# Patient Record
Sex: Female | Born: 1985 | Race: Black or African American | Hispanic: No | Smoking: Current every day smoker
Health system: Southern US, Community
[De-identification: ages and names within clinical notes are randomized; demographics above are authoritative.]

## PROBLEM LIST (undated history)

## (undated) ENCOUNTER — Inpatient Hospital Stay (HOSPITAL_COMMUNITY): Payer: Self-pay

## (undated) ENCOUNTER — Inpatient Hospital Stay (HOSPITAL_COMMUNITY): Payer: Medicaid Other

## (undated) DIAGNOSIS — D649 Anemia, unspecified: Secondary | ICD-10-CM

## (undated) DIAGNOSIS — F419 Anxiety disorder, unspecified: Secondary | ICD-10-CM

## (undated) DIAGNOSIS — Z9109 Other allergy status, other than to drugs and biological substances: Secondary | ICD-10-CM

## (undated) DIAGNOSIS — R011 Cardiac murmur, unspecified: Secondary | ICD-10-CM

## (undated) HISTORY — PX: PILONIDAL CYST EXCISION: SHX744

---

## 2008-08-20 ENCOUNTER — Emergency Department (HOSPITAL_COMMUNITY): Admission: EM | Admit: 2008-08-20 | Discharge: 2008-08-20 | Payer: Self-pay | Admitting: Emergency Medicine

## 2008-09-27 ENCOUNTER — Emergency Department (HOSPITAL_COMMUNITY): Admission: EM | Admit: 2008-09-27 | Discharge: 2008-09-27 | Payer: Self-pay | Admitting: Emergency Medicine

## 2011-08-31 LAB — URINE MICROSCOPIC-ADD ON

## 2011-08-31 LAB — URINALYSIS, ROUTINE W REFLEX MICROSCOPIC
Ketones, ur: 15 — AB
Leukocytes, UA: NEGATIVE
Nitrite: NEGATIVE
Specific Gravity, Urine: 1.022
pH: 6.5

## 2013-03-31 LAB — OB RESULTS CONSOLE HIV ANTIBODY (ROUTINE TESTING): HIV: NONREACTIVE

## 2013-03-31 LAB — OB RESULTS CONSOLE GC/CHLAMYDIA: Chlamydia: NEGATIVE

## 2013-03-31 LAB — OB RESULTS CONSOLE HEPATITIS B SURFACE ANTIGEN: Hepatitis B Surface Ag: NEGATIVE

## 2013-03-31 LAB — OB RESULTS CONSOLE ABO/RH: RH Type: POSITIVE

## 2013-03-31 LAB — OB RESULTS CONSOLE RUBELLA ANTIBODY, IGM: Rubella: IMMUNE

## 2013-09-22 ENCOUNTER — Encounter (HOSPITAL_COMMUNITY): Payer: Self-pay | Admitting: General Practice

## 2013-09-22 ENCOUNTER — Inpatient Hospital Stay (HOSPITAL_COMMUNITY)
Admission: AD | Admit: 2013-09-22 | Discharge: 2013-09-22 | Disposition: A | Discharge status: Home / Self Care | Payer: Medicaid Other | Source: Ambulatory Visit | Attending: Obstetrics & Gynecology | Admitting: Obstetrics & Gynecology

## 2013-09-22 DIAGNOSIS — R030 Elevated blood-pressure reading, without diagnosis of hypertension: Secondary | ICD-10-CM | POA: Insufficient documentation

## 2013-09-22 DIAGNOSIS — O99891 Other specified diseases and conditions complicating pregnancy: Secondary | ICD-10-CM

## 2013-09-22 DIAGNOSIS — R51 Headache: Secondary | ICD-10-CM

## 2013-09-22 DIAGNOSIS — I952 Hypotension due to drugs: Secondary | ICD-10-CM

## 2013-09-22 HISTORY — DX: Other allergy status, other than to drugs and biological substances: Z91.09

## 2013-09-22 LAB — COMPREHENSIVE METABOLIC PANEL
ALT: 16 U/L (ref 0–35)
Alkaline Phosphatase: 79 U/L (ref 39–117)
CO2: 23 mEq/L (ref 19–32)
Chloride: 103 mEq/L (ref 96–112)
GFR calc Af Amer: >90 mL/min (ref >90)
GFR calc non Af Amer: >90 mL/min (ref >90)
Glucose, Bld: 76 mg/dL (ref 70–99)
Potassium: 3.8 mEq/L (ref 3.5–5.1)
Sodium: 134 mEq/L — ABNORMAL LOW (ref 135–145)
Total Bilirubin: 0.2 mg/dL — ABNORMAL LOW (ref 0.3–1.2)

## 2013-09-22 LAB — URINALYSIS, ROUTINE W REFLEX MICROSCOPIC
Bilirubin Urine: NEGATIVE
Hgb urine dipstick: NEGATIVE
Specific Gravity, Urine: 1.02 (ref 1.005–1.030)
Urobilinogen, UA: 0.2 mg/dL (ref 0.0–1.0)

## 2013-09-22 LAB — CBC
Hemoglobin: 11.1 g/dL — ABNORMAL LOW (ref 12.0–15.0)
MCH: 28 pg (ref 26.0–34.0)
RBC: 3.97 MIL/uL (ref 3.87–5.11)
WBC: 7.2 10*3/uL (ref 4.0–10.5)

## 2013-09-22 LAB — PROTEIN / CREATININE RATIO, URINE
Protein Creatinine Ratio: 0.12 (ref 0.00–0.15)
Total Protein, Urine: 16.8 mg/dL

## 2013-09-22 NOTE — MAU Provider Note (Signed)
Chief Complaint:  Headache   Sabrina Bray is a 27 y.o.  G3P1011 with IUP at [redacted]w[redacted]d presenting for Headache  Pt reports onset of headache 2 days ago. She has taken Tylenol 1000 mg on 4 separate occasions since that time. Each time the headache decreases but then returns. 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 Pt denies any prior issues with elevated BP or hypertension. An NP at her job then gave her Clonidine PO 0.2 mg in order to prevent her from "stroking out." Pt then presented to MAU for further evaluation. Since being in the MAU pt's BP has ranged 95-110/40-67. She states her systolic blood pressure typically runs in the low 100s. She continues to experience headache and also endorses feeling sedated since taking the Clonidine. She denies vision changes, RUQ abdominal pain, chest pain, SOB or lower extremity swelling. She denies contractions, vaginal bleeding and LOF. She endorses normal FM.   Pt recently relocated to the area and has not yet established care with a prenatal provider due to insurance issues. She was previously being seen for prenatal care in [place], [place] and reports attending regular appointments until her move. She has not had any prenatal care in 6 weeks. She reports a normal prenatal course until this point. She reports having a normal anatomy scan and is having a girl.    Menstrual History: OB History   Grav Para Term Preterm Abortions TAB SAB Ect Mult Living   3 1 1  1  1   1       Patient's last menstrual period was 02/09/2013.      Past Medical History  Diagnosis Date  . Environmental allergies     Past Surgical History  Procedure Laterality Date  . Cesarean section N/A 11/03/2011  . Pilonidal cyst excision N/A 02/2008    Family History  Problem Relation Age of Onset  . Hypertension Maternal Grandmother   . Hypertension Paternal Grandmother     History  Substance Use Topics  . Smoking  status: Current Every Day Smoker -- 0.50 packs/day  . Smokeless tobacco: Never Used  . Alcohol Use: No     No Known Allergies  Prescriptions prior to admission  Medication Sig Dispense Refill  . acetaminophen (TYLENOL) 500 MG tablet Take 1,000 mg by mouth every 6 (six) hours as needed for pain.      . Docosahexaenoic Acid (PRENATAL DHA PO) Take 1 tablet by mouth daily.        Review of Systems - Negative except for what is mentioned in HPI.  Physical Exam  Blood pressure 95/40, pulse 81, temperature 98.2 F (36.8 C), temperature source Oral, resp. rate 18, height 5' 2.5" (1.588 m), weight 79.833 kg (176 lb), last menstrual period 02/09/2013. GENERAL: Well-developed, well-nourished female in no acute distress.  LUNGS: Clear to auscultation bilaterally.  HEART: Regular rate and rhythm. ABDOMEN: Soft, nontender, nondistended, gravid.  EXTREMITIES: Nontender, no edema, 2+ distal pulses. Cervical Exam: Not done FHT:  Baseline rate 135 bpm   Variability moderate  Accelerations present   Decelerations none Contractions: none   Labs: Results for orders placed during the hospital encounter of 09/22/13 (from the past 24 hour(s))  URINALYSIS, ROUTINE W REFLEX MICROSCOPIC   Collection Time    09/22/13  4:00 PM      Result Value Range   Color, Urine YELLOW  YELLOW   APPearance CLEAR  CLEAR   Specific Gravity,  Urine 1.020  1.005 - 1.030   pH 6.5  5.0 - 8.0   Glucose, UA NEGATIVE  NEGATIVE mg/dL   Hgb urine dipstick NEGATIVE  NEGATIVE   Bilirubin Urine NEGATIVE  NEGATIVE   Ketones, ur NEGATIVE  NEGATIVE mg/dL   Protein, ur NEGATIVE  NEGATIVE mg/dL   Urobilinogen, UA 0.2  0.0 - 1.0 mg/dL   Nitrite NEGATIVE  NEGATIVE   Leukocytes, UA NEGATIVE  NEGATIVE  PROTEIN / CREATININE RATIO, URINE   Collection Time    09/22/13  4:00 PM      Result Value Range   Creatinine, Urine 134.56     Total Protein, Urine 16.8     PROTEIN CREATININE RATIO 0.12  0.00 - 0.15  CBC   Collection Time     09/22/13  5:54 PM      Result Value Range   WBC 7.2  4.0 - 10.5 K/uL   RBC 3.97  3.87 - 5.11 MIL/uL   Hemoglobin 11.1 (*) 12.0 - 15.0 g/dL   HCT 16.1 (*) 09.6 - 04.5 %   MCV 84.1  78.0 - 100.0 fL   MCH 28.0  26.0 - 34.0 pg   MCHC 33.2  30.0 - 36.0 g/dL   RDW 40.9  81.1 - 91.4 %   Platelets 168  150 - 400 K/uL  COMPREHENSIVE METABOLIC PANEL   Collection Time    09/22/13  5:54 PM      Result Value Range   Sodium 134 (*) 135 - 145 mEq/L   Potassium 3.8  3.5 - 5.1 mEq/L   Chloride 103  96 - 112 mEq/L   CO2 23  19 - 32 mEq/L   Glucose, Bld 76  70 - 99 mg/dL   BUN 6  6 - 23 mg/dL   Creatinine, Ser 7.82 (*) 0.50 - 1.10 mg/dL   Calcium 8.9  8.4 - 95.6 mg/dL   Total Protein 6.9  6.0 - 8.3 g/dL   Albumin 3.1 (*) 3.5 - 5.2 g/dL   AST 15  0 - 37 U/L   ALT 16  0 - 35 U/L   Alkaline Phosphatase 79  39 - 117 U/L   Total Bilirubin 0.2 (*) 0.3 - 1.2 mg/dL   GFR calc non Af Amer >90  >90 mL/min   GFR calc Af Amer >90  >90 mL/min    Imaging Studies:  No results found.  Assessment: Sabrina Bray is  27 y.o. G3P1011 at [redacted]w[redacted]d presents with Headache, report of isolated severe range BP earlier today and now with low BP due to Clonidine.   BPs in MAU have been low-normal likely due to medication side effect of Clonidine. FHR reactive and reassuring. CBC, CMP and urine protein creatinine ratio are normal and were done given her hx of one elevated bp at work (although all low normal here)  Plan: -  monitored pt for additional1-2 hours to ensure BPs have reached their nadir.  - Encouraged pt to abstain from taking any medication that is not prescribed to her during pregnancy by an OB provider.  - Message sent to front desk at [facility] requesting follow up appointment in clinic on Monday 09/25/13 for BP check and re-initiation of prenatal care.  - Recommend rest, fluids, Tylenol PRN for headache. - Precautions given and pt advised to return to MAU if she develops BP >140/90, severe headache,  vision changes, lower extremity swelling or RUQ abdominal pain.   Pt seen and discussed with Dr. Reola Calkins.  Cowart, Ryann 10/24/20146:05 PM  I have seen and examined this patient and agree with above documentation in the resident's note. Pt was observed for approx 4 hours and bp ranged 90s-110s/40s-70s. Pt started feeling slightly improved after eating. PIH labs negative as above. 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 Reasons to return as above given. Case discussed with Dr. Debroah Loop who agreed with above plan and d/c to home with f/u on Monday for BP check and scheduling of NOBV.  Rulon Abide, M.D. [facility] 09/22/2013 9:39 PM

## 2013-09-22 NOTE — MAU Note (Signed)
Pt. States she's had a headache x2 days. Has taken some Tylenol with some relief but states headache returns. Was at work and asked someone to take her blood pressure. Pt. States her blood pressure was 167/102. Pt states there was a NP gave her a dose of minipres or cataprea 0.25 or 0.5.

## 2013-09-26 ENCOUNTER — Other Ambulatory Visit: Payer: Medicaid Other | Admitting: General Practice

## 2013-09-26 VITALS — BP 120/76 | HR 92 | Temp 97.5°F | Ht 62.0 in | Wt 175.0 lb

## 2013-09-26 DIAGNOSIS — Z3490 Encounter for supervision of normal pregnancy, unspecified, unspecified trimester: Secondary | ICD-10-CM

## 2013-09-26 LAB — CBC
MCH: 27.9 pg (ref 26.0–34.0)
MCHC: 33.8 g/dL (ref 30.0–36.0)
MCV: 82.7 fL (ref 78.0–100.0)
Platelets: 177 10*3/uL (ref 150–400)

## 2013-09-26 NOTE — Addendum Note (Signed)
Addended by: Kathee Delton on: 09/26/2013 11:00 AM   Modules accepted: Orders

## 2013-09-27 ENCOUNTER — Encounter: Payer: Self-pay | Admitting: *Deleted

## 2013-09-27 ENCOUNTER — Encounter: Payer: Self-pay | Admitting: Obstetrics and Gynecology

## 2013-09-27 DIAGNOSIS — O34219 Maternal care for unspecified type scar from previous cesarean delivery: Secondary | ICD-10-CM | POA: Insufficient documentation

## 2013-09-27 LAB — RPR

## 2013-10-02 ENCOUNTER — Encounter: Payer: Self-pay | Admitting: Family Medicine

## 2013-10-02 ENCOUNTER — Ambulatory Visit (INDEPENDENT_AMBULATORY_CARE_PROVIDER_SITE_OTHER): Payer: Medicaid Other | Admitting: Family Medicine

## 2013-10-02 VITALS — BP 127/86 | Temp 98.5°F | Wt 174.8 lb

## 2013-10-02 DIAGNOSIS — O34219 Maternal care for unspecified type scar from previous cesarean delivery: Secondary | ICD-10-CM

## 2013-10-02 LAB — POCT URINALYSIS DIP (DEVICE)
Hgb urine dipstick: NEGATIVE
Nitrite: NEGATIVE
Protein, ur: 30 mg/dL — AB
pH: 6.5 (ref 5.0–8.0)

## 2013-10-02 NOTE — Progress Notes (Signed)
P= 100  New OB packet given to patient.  C/o intermittent pelvic pressure and braxton hicks contractions.  Pt. 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

## 2013-10-02 NOTE — Patient Instructions (Signed)

## 2013-10-02 NOTE — Progress Notes (Signed)
  Subjective:    Sabrina Bray is being seen today for her first obstetrical visit.  She is a transfer of care from [facility]. This is a planned pregnancy. She is at [redacted]w[redacted]d gestation. Her obstetrical history is significant for hx of LTCS and desires repeat c/s. Patient does intend to breast feed. Pregnancy history fully reviewed.  Patient reports no contractions, no cramping and no leaking. +FM. Some braxton hicks  OB History  Gravida Para Term Preterm AB SAB TAB Ectopic Multiple Living  3 1 1  1 1    1     # Outcome Date GA Lbr Len/2nd Weight Sex Delivery Anes PTL Lv  3 CUR           2 TRM 11/03/11 [redacted]w[redacted]d  3.345 kg (7 lb 6 oz) M LTCS Spinal N Y  1 SAB 04/2008             G1- SAB G2- Term LTCS. Dilated to 8cm and failed to dilate more.   G3- current  Past Medical History  Diagnosis Date  . Environmental allergies    History   Social History  . Marital Status: Unknown    Spouse Name: N/A    Number of Children: N/A  . Years of Education: N/A   Occupational History  . Not on file.   Social History Main Topics  . Smoking status: Current Every Day Smoker -- 0.50 packs/day    Types: Cigarettes  . Smokeless tobacco: Never Used  . Alcohol Use: No  . Drug Use: No  . Sexual Activity: Not Currently    Birth Control/ Protection: None   Other Topics Concern  . 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 Moved to [place] from [place], [place] in August 2014.   \ Family History  Problem Relation Age of Onset  . Hypertension Maternal Grandmother   . Cancer Maternal Grandmother     ovarian   . Hypertension Paternal Grandmother      Review of Systems:   Review of Systems See above  Objective:     BP 127/86  Temp(Src) 98.5 F (36.9 C)  Wt 79.289 kg (174 lb 12.8 oz)  LMP 02/09/2013 Physical Exam  Exam GENERAL: Well-developed, well-nourished female in no acute distress.  HEENT: Normocephalic, atraumatic. Sclerae anicteric.  NECK: Supple.  Normal thyroid.  LUNGS: Clear to auscultation bilaterally.  HEART: Regular rate and rhythm. BREASTS: deferred ABDOMEN: Soft, nontender, nondistended. Uterus approp for dates.  PELVIC: Normal external female genitalia. Vagina is pink and rugated.  Normal discharge. Normal cervix contour. Pap smear obtained.  No adnexal mass or tenderness.  EXTREMITIES: No cyanosis, clubbing, or edema, 2+ distal pulses.    Assessment:    Pregnancy: G3P1011 Patient Active Problem List   Diagnosis Date Noted  . Previous cesarean delivery, antepartum condition or complication 09/27/2013       Plan:     28 week labs reviewed- normal Prenatal vitamins. Problem list reviewed and updated. Obtain records from [facility]  Desires repeat c/s- will schedule after dating confirmed from records.  Follow up in 2 weeks. 50% of 30 min visit spent on counseling and coordination of care.     Kristina Bertone L 10/02/2013

## 2013-10-03 LAB — PRESCRIPTION MONITORING PROFILE (19 PANEL)
Amphetamine/Meth: NEGATIVE ng/mL
Benzodiazepine Screen, Urine: NEGATIVE ng/mL
Buprenorphine, Urine: NEGATIVE ng/mL
Cannabinoid Scrn, Ur: NEGATIVE ng/mL
Carisoprodol, Urine: NEGATIVE ng/mL
MDMA URINE: NEGATIVE ng/mL
Methadone Screen, Urine: NEGATIVE ng/mL
Methaqualone: NEGATIVE ng/mL
Phencyclidine, Ur: NEGATIVE ng/mL
Propoxyphene: NEGATIVE ng/mL
Tapentadol, urine: NEGATIVE ng/mL
Zolpidem, Urine: NEGATIVE ng/mL

## 2013-10-16 ENCOUNTER — Encounter: Payer: Medicaid Other | Admitting: Obstetrics and Gynecology

## 2013-10-23 ENCOUNTER — Ambulatory Visit (INDEPENDENT_AMBULATORY_CARE_PROVIDER_SITE_OTHER): Payer: Medicaid Other | Admitting: Advanced Practice Midwife

## 2013-10-23 VITALS — BP 131/86 | Temp 98.6°F | Wt 179.2 lb

## 2013-10-23 DIAGNOSIS — O0931 Supervision of pregnancy with insufficient antenatal care, first trimester: Secondary | ICD-10-CM

## 2013-10-23 DIAGNOSIS — O34219 Maternal care for unspecified type scar from previous cesarean delivery: Secondary | ICD-10-CM

## 2013-10-23 DIAGNOSIS — O0933 Supervision of pregnancy with insufficient antenatal care, third trimester: Secondary | ICD-10-CM

## 2013-10-23 DIAGNOSIS — O0930 Supervision of pregnancy with insufficient antenatal care, unspecified trimester: Secondary | ICD-10-CM

## 2013-10-23 LAB — POCT URINALYSIS DIP (DEVICE)
Bilirubin Urine: NEGATIVE
Ketones, ur: NEGATIVE mg/dL
Protein, ur: 30 mg/dL — AB
Specific Gravity, Urine: >=1.03 (ref 1.005–1.030)

## 2013-10-23 NOTE — Progress Notes (Signed)
Doing well.  Good fetal movement, denies vaginal bleeding, LOF, regular contractions.  Evaluated stretch mark on abdomen, discussed normal skin change.  Denies h/a, epigastric pain, visual disturbances.  Pt desires repeat C/S, GBS not collected today.

## 2013-10-23 NOTE — Progress Notes (Signed)
Pulse- 102  Pain-sharp  Pt reports a "vein like" stretch mark on her belly

## 2013-10-24 LAB — GC/CHLAMYDIA PROBE AMP
CT Probe RNA: NEGATIVE
GC Probe RNA: NEGATIVE

## 2013-11-01 ENCOUNTER — Ambulatory Visit (INDEPENDENT_AMBULATORY_CARE_PROVIDER_SITE_OTHER): Payer: Medicaid Other | Admitting: Obstetrics and Gynecology

## 2013-11-01 VITALS — BP 129/83 | Wt 177.8 lb

## 2013-11-01 DIAGNOSIS — O34219 Maternal care for unspecified type scar from previous cesarean delivery: Secondary | ICD-10-CM

## 2013-11-01 LAB — POCT URINALYSIS DIP (DEVICE)
Bilirubin Urine: NEGATIVE
Glucose, UA: NEGATIVE mg/dL
Ketones, ur: 15 mg/dL — AB
Specific Gravity, Urine: 1.025 (ref 1.005–1.030)
Urobilinogen, UA: 1 mg/dL (ref 0.0–1.0)

## 2013-11-01 NOTE — Progress Notes (Signed)
Good fetal movement. Continues to have irregular contractions with mild pain. No lose of fluids, or vaginal bleeding. Wants to schedule repeat C-section. Note to [place] to schedule.

## 2013-11-01 NOTE — Patient Instructions (Signed)
Cesarean Delivery °Care After °Refer to this sheet in the next few weeks. These instructions provide you with information on caring for yourself after your procedure. Your health care provider may also give you specific instructions. Your treatment has been planned according to current medical practices, but problems sometimes occur. Call your health care provider if you have any problems or questions after you go home. °HOME CARE INSTRUCTIONS  °· Only take over-the-counter or prescription medications as directed by your health care provider. °· Do not drink alcohol, especially if you are breastfeeding or taking medication to relieve pain. °· Do not chew or smoke tobacco. °· Continue to use good perineal care. Good perineal care includes: °· Wiping your perineum from front to back. °· Keeping your perineum clean. °· Check your surgical cut (incision) daily for increased redness, drainage, swelling, or separation of skin. °· Clean your incision gently with soap and water every day, and then pat it dry. If your health care provider says it is OK, leave the incision uncovered. Use a bandage (dressing) if the incision is draining fluid or appears irritated. If the adhesive strips across the incision do not fall off within 7 days, carefully peel them off. °· Hug a pillow when coughing or sneezing until your incision is healed. This helps to relieve pain. °· Do not use tampons or douche until your health care provider says it is okay. °· Shower, wash your hair, and take tub baths as directed by your health care provider. °· Wear a well-fitting bra that provides breast support. °· Limit wearing support panties or control-top hose. °· Drink enough fluids to keep your urine clear or pale yellow. °· Eat high-fiber foods such as whole grain cereals and breads, brown rice, beans, and fresh fruits and vegetables every day. These foods may help prevent or relieve constipation. °· Resume activities such as climbing stairs,  driving, lifting, exercising, or traveling as directed by your health care provider. °· Talk to your health care provider about resuming sexual activities. This is dependent upon your risk of infection, your rate of healing, and your comfort and desire to resume sexual activity. °· Try to have someone help you with your household activities and your newborn for at least a few days after you leave the hospital. °· Rest as much as possible. Try to rest or take a nap when your newborn is sleeping. °· Increase your activities gradually. °· Keep all of your scheduled postpartum appointments. It is very important to keep your scheduled follow-up appointments. At these appointments, your health care provider will be checking to make sure that you are healing physically and emotionally. °SEEK MEDICAL CARE IF:  °· You are passing large clots from your vagina. Save any clots to show your health care provider. °· You have a foul smelling discharge from your vagina. °· You have trouble urinating. °· You are urinating frequently. °· You have pain when you urinate. °· You have a change in your bowel movements. °· You have increasing redness, pain, or swelling near your incision. °· You have pus draining from your incision. °· Your incision is separating. °· You have painful, hard, or reddened breasts. °· You have a severe headache. °· You have blurred vision or see spots. °· You feel sad or depressed. °· You have thoughts of hurting yourself or your newborn. °· You have questions about your care, the care of your newborn, or medications. °· You are dizzy or lightheaded. °· You have a rash. °· You   have pain, redness, or swelling at the site of the removed intravenous access (IV) tube. °· You have nausea or vomiting. °· You stopped breastfeeding and have not had a menstrual period within 12 weeks of stopping. °· You are not breastfeeding and have not had a menstrual period within 12 weeks of delivery. °· You have a fever. °SEEK  IMMEDIATE MEDICAL CARE IF: °· You have persistent pain. °· You have chest pain. °· You have shortness of breath. °· You faint. °· You have leg pain. °· You have stomach pain. °· Your vaginal bleeding saturates 2 or more sanitary pads in 1 hour. °MAKE SURE YOU:  °· Understand these instructions. °· Will watch your condition. °· Will get help right away if you are not doing well or get worse. °Document Released: 08/08/2002 Document Revised: 07/19/2013 Document Reviewed: 07/13/2012 °ExitCare® Patient Information ©2014 [employer]. ° ° ° °

## 2013-11-01 NOTE — Progress Notes (Signed)
Pulse: 82 Pt states that she is supposed to be scheduled for a csection for next week but hasn't heard anything. Very uncomfortable ready to have this baby.

## 2013-11-07 ENCOUNTER — Encounter (HOSPITAL_COMMUNITY)
Admission: RE | Admit: 2013-11-07 | Discharge: 2013-11-07 | Disposition: A | Discharge status: Home / Self Care | Payer: Medicaid Other | Source: Ambulatory Visit | Attending: Obstetrics & Gynecology | Admitting: Obstetrics & Gynecology

## 2013-11-07 ENCOUNTER — Encounter (HOSPITAL_COMMUNITY): Payer: Self-pay

## 2013-11-07 VITALS — BP 117/87 | HR 92 | Resp 18 | Ht 62.0 in | Wt 177.0 lb

## 2013-11-07 DIAGNOSIS — O34219 Maternal care for unspecified type scar from previous cesarean delivery: Secondary | ICD-10-CM

## 2013-11-07 HISTORY — DX: Cardiac murmur, unspecified: R01.1

## 2013-11-07 HISTORY — DX: Anxiety disorder, unspecified: F41.9

## 2013-11-07 HISTORY — DX: Anemia, unspecified: D64.9

## 2013-11-07 LAB — CBC
HCT: 32.8 % — ABNORMAL LOW (ref 36.0–46.0)
Hemoglobin: 11 g/dL — ABNORMAL LOW (ref 12.0–15.0)
MCH: 28 pg (ref 26.0–34.0)
MCV: 83.5 fL (ref 78.0–100.0)
Platelets: 178 10*3/uL (ref 150–400)
RBC: 3.93 MIL/uL (ref 3.87–5.11)
WBC: 6.3 10*3/uL (ref 4.0–10.5)

## 2013-11-07 LAB — TYPE AND SCREEN

## 2013-11-07 LAB — ABO/RH: ABO/RH(D): O POS

## 2013-11-07 LAB — RPR: RPR Ser Ql: NONREACTIVE

## 2013-11-07 NOTE — Patient Instructions (Addendum)
   Your procedure is scheduled on: Thursday, Dec 11  Enter through the [employer] of [facility] at:  1130 am Pick up the phone at the desk and dial [phone] and inform us of your arrival.  Please call this number if you have any problems the morning of surgery: [phone]  Remember: Do not eat food after midnight: Wednesday Do not drink clear liquids after:  9 am Thursday Take these medicines the morning of surgery with a SIP OF WATER:   None  Do not wear jewelry, make-up, or FINGER nail polish No metal in your hair or on your body. Do not wear lotions, powders, perfumes. You may wear deodorant.  Please use your CHG wash as directed prior to surgery.  Do not shave anywhere for at least 12 hours prior to first CHG shower.  Do not bring valuables to the hospital. Contacts, dentures or bridgework may not be worn into surgery.  Leave suitcase in the car. After Surgery it may be brought to your room. For patients being admitted to the hospital, checkout time is 11:00am the day of discharge.  Home with fob Greg cell [phone].

## 2013-11-08 ENCOUNTER — Encounter: Payer: Self-pay | Admitting: *Deleted

## 2013-11-09 ENCOUNTER — Inpatient Hospital Stay (HOSPITAL_COMMUNITY): Payer: Medicaid Other | Admitting: Anesthesiology

## 2013-11-09 ENCOUNTER — Encounter (HOSPITAL_COMMUNITY): Payer: Self-pay | Admitting: *Deleted

## 2013-11-09 ENCOUNTER — Encounter (HOSPITAL_COMMUNITY): Payer: Medicaid Other | Admitting: Anesthesiology

## 2013-11-09 ENCOUNTER — Inpatient Hospital Stay (HOSPITAL_COMMUNITY)
Admission: RE | Admit: 2013-11-09 | Discharge: 2013-11-11 | DRG: 766 | Disposition: A | Discharge status: Home / Self Care | Payer: Medicaid Other | Source: Ambulatory Visit | Attending: Obstetrics & Gynecology | Admitting: Obstetrics & Gynecology

## 2013-11-09 ENCOUNTER — Encounter (HOSPITAL_COMMUNITY)
Admission: RE | Disposition: A | Discharge status: Home / Self Care | Payer: Self-pay | Source: Ambulatory Visit | Attending: Obstetrics & Gynecology

## 2013-11-09 DIAGNOSIS — O34219 Maternal care for unspecified type scar from previous cesarean delivery: Secondary | ICD-10-CM

## 2013-11-09 DIAGNOSIS — O99334 Smoking (tobacco) complicating childbirth: Secondary | ICD-10-CM | POA: Diagnosis present

## 2013-11-09 DIAGNOSIS — Z98891 History of uterine scar from previous surgery: Secondary | ICD-10-CM

## 2013-11-09 SURGERY — Surgical Case
Anesthesia: Spinal | Site: Abdomen

## 2013-11-09 MED ORDER — LACTATED RINGERS IV SOLN: INTRAVENOUS | Status: DC

## 2013-11-09 MED ORDER — KETOROLAC TROMETHAMINE 30 MG/ML IJ SOLN
15.0000 mg | Freq: Once | INTRAMUSCULAR | Status: AC | PRN
  Administered 2013-11-09: 30 mg via INTRAVENOUS

## 2013-11-09 MED ORDER — ONDANSETRON HCL 4 MG PO TABS: 4.0000 mg | ORAL_TABLET | ORAL | Status: DC | PRN

## 2013-11-09 MED ORDER — OXYTOCIN 10 UNIT/ML IJ SOLN
INTRAMUSCULAR | Status: AC
  Filled 2013-11-09: qty 4

## 2013-11-09 MED ORDER — DEXTROSE 5 % IV SOLN: 1.0000 ug/kg/h | INTRAVENOUS | Status: DC | PRN

## 2013-11-09 MED ORDER — TETANUS-DIPHTH-ACELL PERTUSSIS 5-2.5-18.5 LF-MCG/0.5 IM SUSP: 0.5000 mL | Freq: Once | INTRAMUSCULAR | Status: DC

## 2013-11-09 MED ORDER — NALOXONE HCL 0.4 MG/ML IJ SOLN: 0.4000 mg | INTRAMUSCULAR | Status: DC | PRN

## 2013-11-09 MED ORDER — DIBUCAINE 1 % RE OINT: 1.0000 "application " | TOPICAL_OINTMENT | RECTAL | Status: DC | PRN

## 2013-11-09 MED ORDER — SCOPOLAMINE 1 MG/3DAYS TD PT72: 1.0000 | MEDICATED_PATCH | Freq: Once | TRANSDERMAL | Status: DC

## 2013-11-09 MED ORDER — ONDANSETRON HCL 4 MG/2ML IJ SOLN
INTRAMUSCULAR | Status: AC
  Filled 2013-11-09: qty 2

## 2013-11-09 MED ORDER — MEASLES, MUMPS & RUBELLA VAC ~~LOC~~ INJ: 0.5000 mL | INJECTION | Freq: Once | SUBCUTANEOUS | Status: DC

## 2013-11-09 MED ORDER — METOCLOPRAMIDE HCL 5 MG/ML IJ SOLN: 10.0000 mg | Freq: Three times a day (TID) | INTRAMUSCULAR | Status: DC | PRN

## 2013-11-09 MED ORDER — LACTATED RINGERS IV SOLN
INTRAVENOUS | Status: DC
  Administered 2013-11-09 (×3): via INTRAVENOUS

## 2013-11-09 MED ORDER — HYDROMORPHONE HCL PF 1 MG/ML IJ SOLN
INTRAMUSCULAR | Status: AC
  Administered 2013-11-09: 0.5 mg via INTRAVENOUS
  Filled 2013-11-09: qty 1

## 2013-11-09 MED ORDER — MEPERIDINE HCL 25 MG/ML IJ SOLN: 6.2500 mg | INTRAMUSCULAR | Status: DC | PRN

## 2013-11-09 MED ORDER — BUPIVACAINE IN DEXTROSE 0.75-8.25 % IT SOLN
INTRATHECAL | Status: DC | PRN
  Administered 2013-11-09: 1.3 mL via INTRATHECAL

## 2013-11-09 MED ORDER — CEFAZOLIN SODIUM-DEXTROSE 2-3 GM-% IV SOLR: 2.0000 g | INTRAVENOUS | Status: DC

## 2013-11-09 MED ORDER — FENTANYL CITRATE 0.05 MG/ML IJ SOLN
INTRAMUSCULAR | Status: DC | PRN
  Administered 2013-11-09: 12.5 ug via INTRATHECAL

## 2013-11-09 MED ORDER — MAGNESIUM HYDROXIDE 400 MG/5ML PO SUSP: 30.0000 mL | ORAL | Status: DC | PRN

## 2013-11-09 MED ORDER — PRENATAL MULTIVITAMIN CH
1.0000 | ORAL_TABLET | Freq: Every day | ORAL | Status: DC
  Administered 2013-11-10 – 2013-11-11 (×2): 1 via ORAL
  Filled 2013-11-09 (×2): qty 1

## 2013-11-09 MED ORDER — SCOPOLAMINE 1 MG/3DAYS TD PT72
1.0000 | MEDICATED_PATCH | Freq: Once | TRANSDERMAL | Status: DC
  Administered 2013-11-09: 1.5 mg via TRANSDERMAL

## 2013-11-09 MED ORDER — ONDANSETRON HCL 4 MG/2ML IJ SOLN: 4.0000 mg | INTRAMUSCULAR | Status: DC | PRN

## 2013-11-09 MED ORDER — LANOLIN HYDROUS EX OINT: 1.0000 "application " | TOPICAL_OINTMENT | CUTANEOUS | Status: DC | PRN

## 2013-11-09 MED ORDER — SCOPOLAMINE 1 MG/3DAYS TD PT72
MEDICATED_PATCH | TRANSDERMAL | Status: AC
  Filled 2013-11-09: qty 1

## 2013-11-09 MED ORDER — NALBUPHINE SYRINGE 5 MG/0.5 ML
INJECTION | INTRAMUSCULAR | Status: AC
  Administered 2013-11-09: 10 mg via SUBCUTANEOUS
  Filled 2013-11-09: qty 1

## 2013-11-09 MED ORDER — MENTHOL 3 MG MT LOZG: 1.0000 | LOZENGE | OROMUCOSAL | Status: DC | PRN

## 2013-11-09 MED ORDER — OXYCODONE-ACETAMINOPHEN 5-325 MG PO TABS
1.0000 | ORAL_TABLET | ORAL | Status: DC | PRN
  Administered 2013-11-10: 1 via ORAL
  Administered 2013-11-10 (×3): 2 via ORAL
  Administered 2013-11-10 – 2013-11-11 (×3): 1 via ORAL
  Filled 2013-11-09 (×3): qty 2
  Filled 2013-11-09 (×2): qty 1
  Filled 2013-11-09: qty 2

## 2013-11-09 MED ORDER — LACTATED RINGERS IV SOLN
INTRAVENOUS | Status: DC
  Administered 2013-11-10: via INTRAVENOUS

## 2013-11-09 MED ORDER — SIMETHICONE 80 MG PO CHEW
80.0000 mg | CHEWABLE_TABLET | Freq: Three times a day (TID) | ORAL | Status: DC
  Administered 2013-11-10 – 2013-11-11 (×2): 80 mg via ORAL
  Filled 2013-11-09 (×6): qty 1

## 2013-11-09 MED ORDER — SODIUM CHLORIDE 0.9 % IJ SOLN: 3.0000 mL | INTRAMUSCULAR | Status: DC | PRN

## 2013-11-09 MED ORDER — IBUPROFEN 600 MG PO TABS
600.0000 mg | ORAL_TABLET | Freq: Four times a day (QID) | ORAL | Status: DC | PRN
  Administered 2013-11-11: 600 mg via ORAL

## 2013-11-09 MED ORDER — ONDANSETRON HCL 4 MG/2ML IJ SOLN: 4.0000 mg | Freq: Three times a day (TID) | INTRAMUSCULAR | Status: DC | PRN

## 2013-11-09 MED ORDER — OXYTOCIN 40 UNITS IN LACTATED RINGERS INFUSION - SIMPLE MED: 62.5000 mL/h | INTRAVENOUS | Status: AC

## 2013-11-09 MED ORDER — MORPHINE SULFATE (PF) 0.5 MG/ML IJ SOLN
INTRAMUSCULAR | Status: DC | PRN
  Administered 2013-11-09: .2 mg via INTRATHECAL

## 2013-11-09 MED ORDER — KETOROLAC TROMETHAMINE 30 MG/ML IJ SOLN: 30.0000 mg | Freq: Four times a day (QID) | INTRAMUSCULAR | Status: AC | PRN

## 2013-11-09 MED ORDER — SIMETHICONE 80 MG PO CHEW
80.0000 mg | CHEWABLE_TABLET | ORAL | Status: DC | PRN
  Administered 2013-11-10: 80 mg via ORAL

## 2013-11-09 MED ORDER — DIPHENHYDRAMINE HCL 25 MG PO CAPS: 25.0000 mg | ORAL_CAPSULE | ORAL | Status: DC | PRN

## 2013-11-09 MED ORDER — PHENYLEPHRINE 8 MG IN D5W 100 ML (0.08MG/ML) PREMIX OPTIME
INJECTION | INTRAVENOUS | Status: AC
  Filled 2013-11-09: qty 100

## 2013-11-09 MED ORDER — IBUPROFEN 600 MG PO TABS
600.0000 mg | ORAL_TABLET | Freq: Four times a day (QID) | ORAL | Status: DC
  Administered 2013-11-10 – 2013-11-11 (×6): 600 mg via ORAL
  Filled 2013-11-09 (×7): qty 1

## 2013-11-09 MED ORDER — DIPHENHYDRAMINE HCL 50 MG/ML IJ SOLN: 25.0000 mg | INTRAMUSCULAR | Status: DC | PRN

## 2013-11-09 MED ORDER — DIPHENHYDRAMINE HCL 25 MG PO CAPS: 25.0000 mg | ORAL_CAPSULE | Freq: Four times a day (QID) | ORAL | Status: DC | PRN

## 2013-11-09 MED ORDER — CEFAZOLIN SODIUM-DEXTROSE 2-3 GM-% IV SOLR
INTRAVENOUS | Status: AC
  Administered 2013-11-09: 2 g via INTRAVENOUS
  Filled 2013-11-09: qty 50

## 2013-11-09 MED ORDER — FENTANYL CITRATE 0.05 MG/ML IJ SOLN
INTRAMUSCULAR | Status: AC
Start: 2013-11-09 — End: 2013-11-09
  Filled 2013-11-09: qty 2

## 2013-11-09 MED ORDER — OXYTOCIN 40 UNITS IN LACTATED RINGERS INFUSION - SIMPLE MED
INTRAVENOUS | Status: DC | PRN
  Administered 2013-11-09: 40 [IU] via INTRAVENOUS

## 2013-11-09 MED ORDER — NALBUPHINE HCL 10 MG/ML IJ SOLN
5.0000 mg | INTRAMUSCULAR | Status: DC | PRN
  Administered 2013-11-09: 10 mg via SUBCUTANEOUS

## 2013-11-09 MED ORDER — PROMETHAZINE HCL 25 MG/ML IJ SOLN: 6.2500 mg | INTRAMUSCULAR | Status: DC | PRN

## 2013-11-09 MED ORDER — NALBUPHINE HCL 10 MG/ML IJ SOLN: 5.0000 mg | INTRAMUSCULAR | Status: DC | PRN

## 2013-11-09 MED ORDER — ZOLPIDEM TARTRATE 5 MG PO TABS: 5.0000 mg | ORAL_TABLET | Freq: Every evening | ORAL | Status: DC | PRN

## 2013-11-09 MED ORDER — KETOROLAC TROMETHAMINE 30 MG/ML IJ SOLN
INTRAMUSCULAR | Status: AC
  Administered 2013-11-09: 30 mg via INTRAVENOUS
  Filled 2013-11-09: qty 1

## 2013-11-09 MED ORDER — SENNOSIDES-DOCUSATE SODIUM 8.6-50 MG PO TABS
2.0000 | ORAL_TABLET | ORAL | Status: DC
  Administered 2013-11-10 (×2): 2 via ORAL
  Filled 2013-11-09 (×2): qty 2

## 2013-11-09 MED ORDER — PHENYLEPHRINE 8 MG IN D5W 100 ML (0.08MG/ML) PREMIX OPTIME
INJECTION | INTRAVENOUS | Status: DC | PRN
  Administered 2013-11-09: 40 ug/min via INTRAVENOUS

## 2013-11-09 MED ORDER — DIPHENHYDRAMINE HCL 50 MG/ML IJ SOLN: 12.5000 mg | INTRAMUSCULAR | Status: DC | PRN

## 2013-11-09 MED ORDER — HYDROMORPHONE HCL PF 1 MG/ML IJ SOLN
0.2500 mg | INTRAMUSCULAR | Status: DC | PRN
  Administered 2013-11-09 (×4): 0.5 mg via INTRAVENOUS

## 2013-11-09 MED ORDER — LACTATED RINGERS IV SOLN: Freq: Once | INTRAVENOUS | Status: DC

## 2013-11-09 MED ORDER — ONDANSETRON HCL 4 MG/2ML IJ SOLN
INTRAMUSCULAR | Status: DC | PRN
  Administered 2013-11-09: 4 mg via INTRAVENOUS

## 2013-11-09 MED ORDER — MORPHINE SULFATE 0.5 MG/ML IJ SOLN
INTRAMUSCULAR | Status: AC
  Filled 2013-11-09: qty 10

## 2013-11-09 MED ORDER — SIMETHICONE 80 MG PO CHEW
80.0000 mg | CHEWABLE_TABLET | ORAL | Status: DC
  Administered 2013-11-10 (×2): 80 mg via ORAL
  Filled 2013-11-09 (×2): qty 1

## 2013-11-09 MED ORDER — WITCH HAZEL-GLYCERIN EX PADS: 1.0000 "application " | MEDICATED_PAD | CUTANEOUS | Status: DC | PRN

## 2013-11-09 SURGICAL SUPPLY — 36 items
BENZOIN TINCTURE PRP APPL 2/3 (GAUZE/BANDAGES/DRESSINGS) ×2 IMPLANT
CLAMP CORD UMBIL (MISCELLANEOUS) IMPLANT
CLOTH BEACON ORANGE TIMEOUT ST (SAFETY) ×2 IMPLANT
CONTAINER PREFILL 10% NBF 15ML (MISCELLANEOUS) IMPLANT
DRAIN JACKSON PRT FLT 7MM (DRAIN) IMPLANT
DRAPE LG THREE QUARTER DISP (DRAPES) IMPLANT
DRSG OPSITE POSTOP 4X10 (GAUZE/BANDAGES/DRESSINGS) ×2 IMPLANT
DURAPREP 26ML APPLICATOR (WOUND CARE) ×2 IMPLANT
ELECT REM PT RETURN 9FT ADLT (ELECTROSURGICAL) ×2
ELECTRODE REM PT RTRN 9FT ADLT (ELECTROSURGICAL) ×1 IMPLANT
EVACUATOR SILICONE 100CC (DRAIN) IMPLANT
EXTRACTOR VACUUM M CUP 4 TUBE (SUCTIONS) IMPLANT
GLOVE BIO SURGEON STRL SZ7 (GLOVE) ×2 IMPLANT
GLOVE BIOGEL PI IND STRL 7.0 (GLOVE) ×1 IMPLANT
GLOVE BIOGEL PI INDICATOR 7.0 (GLOVE) ×1
GOWN PREVENTION PLUS XLARGE (GOWN DISPOSABLE) ×4 IMPLANT
GOWN STRL REIN XL XLG (GOWN DISPOSABLE) ×4 IMPLANT
KIT ABG SYR 3ML LUER SLIP (SYRINGE) IMPLANT
NEEDLE HYPO 25X5/8 SAFETYGLIDE (NEEDLE) ×2 IMPLANT
NS IRRIG 1000ML POUR BTL (IV SOLUTION) ×2 IMPLANT
PACK C SECTION WH (CUSTOM PROCEDURE TRAY) ×2 IMPLANT
PAD ABD 7.5X8 STRL (GAUZE/BANDAGES/DRESSINGS) ×2 IMPLANT
PAD OB MATERNITY 4.3X12.25 (PERSONAL CARE ITEMS) ×2 IMPLANT
RTRCTR C-SECT PINK 25CM LRG (MISCELLANEOUS) ×2 IMPLANT
STAPLER VISISTAT 35W (STAPLE) IMPLANT
STRIP CLOSURE SKIN 1/2X4 (GAUZE/BANDAGES/DRESSINGS) ×2 IMPLANT
SUT CHROMIC 2 0 SH (SUTURE) ×2 IMPLANT
SUT VIC AB 0 CTX 36 (SUTURE) ×5
SUT VIC AB 0 CTX36XBRD ANBCTRL (SUTURE) ×5 IMPLANT
SUT VIC AB 2-0 CT1 27 (SUTURE) ×2
SUT VIC AB 2-0 CT1 TAPERPNT 27 (SUTURE) ×2 IMPLANT
SUT VIC AB 4-0 KS 27 (SUTURE) ×2 IMPLANT
TAPE CLOTH SURG 4X10 WHT LF (GAUZE/BANDAGES/DRESSINGS) ×2 IMPLANT
TOWEL OR 17X24 6PK STRL BLUE (TOWEL DISPOSABLE) ×2 IMPLANT
TRAY FOLEY CATH 14FR (SET/KITS/TRAYS/PACK) ×2 IMPLANT
WATER STERILE IRR 1000ML POUR (IV SOLUTION) IMPLANT

## 2013-11-09 NOTE — Lactation Note (Signed)
This note was copied from the chart of Sabrina Chinaza Rooke. Lactation Consultation Note Initial visit at 6 hours of age.  Mom holding baby skin to skin, no feeding cues at this time.  Baby has good suck on gloved finger.  Attempted latch in football hold baby to sleepy.  Mom demonstrated hand expression, but no colostrum visible at this time.  Encouraged skin to skin and to feed with early feeding cues.  Mom denies pain at this time since baby hasn't really latched yet.  Mom to call for assist as needed. [facility] LC resources given and discussed.  Patient Name: Sabrina Bray [id number] Date: 11/09/2013 Reason for consult: Initial assessment   Maternal Data Formula Feeding for Exclusion: No Does the patient have breastfeeding experience prior to this delivery?: Yes  Feeding Feeding Type: Breast Fed  LATCH Score/Interventions       Type of Nipple: Everted at rest and after stimulation  Comfort (Breast/Nipple): Soft / non-tender     Hold (Positioning): Assistance needed to correctly position infant at breast and maintain latch.     Lactation Tools Discussed/Used     Consult Status Consult Status: Follow-up Date: 11/10/13 Follow-up type: In-patient    Sabrina Bray 11/09/2013, 9:17 PM

## 2013-11-09 NOTE — Transfer of Care (Signed)
Immediate Anesthesia Transfer of Care Note  Patient: Sabrina Bray  Procedure(s) Performed: Procedure(s): REPEAT CESAREAN SECTION (N/A)  Patient Location: PACU  Anesthesia Type:Spinal  Level of Consciousness: awake, alert  and oriented  Airway & Oxygen Therapy: Patient Spontanous Breathing  Post-op Assessment: Report given to PACU RN and Post -op Vital signs reviewed and stable  Post vital signs: Reviewed and stable  Complications: No apparent anesthesia complications

## 2013-11-09 NOTE — Anesthesia Preprocedure Evaluation (Signed)
Anesthesia Evaluation  Patient identified by MRN, date of birth, ID band Patient awake    Reviewed: Allergy & Precautions, H&P , NPO status , Patient's Chart, lab work & pertinent test results  Airway Mallampati: II TM Distance: >3 FB Neck ROM: full    Dental no notable dental hx. (+) Teeth Intact   Pulmonary neg pulmonary ROS, Current Smoker,    Pulmonary exam normal       Cardiovascular negative cardio ROS      Neuro/Psych negative neurological ROS  negative psych ROS   GI/Hepatic negative GI ROS, Neg liver ROS,   Endo/Other  negative endocrine ROS  Renal/GU negative Renal ROS  negative genitourinary   Musculoskeletal negative musculoskeletal ROS (+)   Abdominal Normal abdominal exam  (+)   Peds  Hematology   Anesthesia Other Findings   Reproductive/Obstetrics negative OB ROS (+) Pregnancy                           Anesthesia Physical Anesthesia Plan  ASA: II  Anesthesia Plan: Spinal   Post-op Pain Management:    Induction:   Airway Management Planned:   Additional Equipment:   Intra-op Plan:   Post-operative Plan:   Informed Consent: I have reviewed the patients History and Physical, chart, labs and discussed the procedure including the risks, benefits and alternatives for the proposed anesthesia with the patient or authorized representative who has indicated his/her understanding and acceptance.     Plan Discussed with: CRNA and Surgeon  Anesthesia Plan Comments:         Anesthesia Quick Evaluation

## 2013-11-09 NOTE — Progress Notes (Signed)
Took report from deb

## 2013-11-09 NOTE — Op Note (Signed)
Shailynn Fong PROCEDURE DATE: 11/09/2013  PREOPERATIVE DIAGNOSES: Intrauterine pregnancy at  [redacted]w[redacted]d weeks gestation; elective repeat. previous c/s x1  POSTOPERATIVE DIAGNOSES: The same  PROCEDURE: Repeat Low Transverse Cesarean Section  SURGEON:  Dr. Elsie Lincoln  ASSISTANT:  Dr. Rulon Abide  ANESTHESIOLOGIST: Dr. Lilli Light  INDICATIONS: Sabrina Bray is a 27 y.o. [id number] at [redacted]w[redacted]d here for cesarean section secondary to the indications listed under preoperative diagnoses; please see preoperative note for further details.  The risks of cesarean section were discussed with the patient including but were not limited to: bleeding which may require transfusion or reoperation; infection which may require antibiotics; injury to bowel, bladder, ureters or other surrounding organs; injury to the fetus; need for additional procedures including hysterectomy in the event of a life-threatening hemorrhage; placental abnormalities wth subsequent pregnancies, incisional problems, thromboembolic phenomenon and other postoperative/anesthesia complications.   The patient concurred with the proposed plan, giving informed written consent for the procedure.    FINDINGS:  Viable female infant in cephalic presentation.  Apgars 8 and 9.  Clear amniotic fluid.  Intact placenta, three vessel cord.  Normal uterus, fallopian tubes and ovaries bilaterally.  ANESTHESIA: Spinal INTRAVENOUS FLUIDS: 1200 ml ESTIMATED BLOOD LOSS: 500 ml URINE OUTPUT:  100 ml SPECIMENS: Placenta sent to L&D COMPLICATIONS: None immediate  PROCEDURE IN DETAIL:  The patient preoperatively received intravenous antibiotics and had sequential compression devices applied to her lower extremities.  She was then taken to the operating room where spinal anesthesia was administered and was found to be adequate. She was then placed in a dorsal supine position with a leftward tilt, and prepped and draped in a sterile manner.  A foley catheter was placed into  her bladder and attached to constant gravity.  After an adequate timeout was performed, an elliptical skin incision was made with the scalpel in order to excise her keloid. This was performed without difficulty and the incision was then carried through to the underlying layer of fascia. The fascia was incised in the midline, and this incision was extended bilaterally using the Mayo scissors.  Kocher clamps were applied to the superior aspect of the fascial incision and the underlying rectus muscles were dissected off bluntly. A similar process was carried out on the inferior aspect of the fascial incision. The rectus muscles were separated in the midline bluntly and the peritoneum was entered bluntly. Not enough room was created so 1/3 of the rectus muscles were incised with electrocautery in order to make more room.  Attention was turned to the lower uterine segment where a low transverse hysterotomy was made with a scalpel and extended bilaterally bluntly.  The infant was successfully delivered, the cord was clamped and cut and the infant was handed over to awaiting neonatology team. Uterine massage was then administered, and the placenta delivered intact with a three-vessel cord. The uterus was then cleared of clot and debris.  The hysterotomy was closed with 0 Vicryl in a running locked fashion, and an imbricating layer was also placed with 0 Vicryl. A smaller third layer with 0 vicryl was used to reapproximate serosal layer due to continued bleeding.  A small extension in the serosa on the left side was closed as well using a running stitch. The pelvis was cleared of all clot and debris. Hemostasis was confirmed on all surfaces.  The muscles were reapproximated using 0 Vicryl interrupted stitches. The fascia was then closed using 0 Vicryl in a running fashion.  The subcutaneous layer was irrigated.  The skin  was closed with a 4-0 Vicryl subcuticular stitch. The patient tolerated the procedure well. Sponge, lap,  instrument and needle counts were correct x 2.  She was taken to the recovery room in stable condition.   Rulon Abide, MD OB Fellow

## 2013-11-09 NOTE — Anesthesia Procedure Notes (Signed)
Spinal  Patient location during procedure: OR Start time: 11/09/2013 1:47 PM End time: 11/09/2013 1:50 PM Staffing Anesthesiologist: Leilani Able Performed by: anesthesiologist  Preanesthetic Checklist Completed: patient identified, surgical consent, pre-op evaluation, timeout performed, IV checked, risks and benefits discussed and monitors and equipment checked Spinal Block Patient position: sitting Prep: DuraPrep Patient monitoring: heart rate, cardiac monitor, continuous pulse ox and blood pressure Approach: midline Location: L3-4 Injection technique: single-shot Needle Needle type: Sprotte  Needle gauge: 24 G Needle length: 9 cm Needle insertion depth: 7 cm Assessment Sensory level: T4

## 2013-11-09 NOTE — Anesthesia Postprocedure Evaluation (Signed)
Anesthesia Post Note  Patient: Sabrina Bray  Procedure(s) Performed: Procedure(s) (LRB): REPEAT CESAREAN SECTION (N/A)  Anesthesia type: Spinal  Patient location: PACU  Post pain: Pain level controlled  Post assessment: Post-op Vital signs reviewed  Last Vitals:  Filed Vitals:   11/09/13 1154  BP: 114/79  Pulse: 87  Temp: 36.9 C  Resp: 18    Post vital signs: Reviewed  Level of consciousness: awake  Complications: No apparent anesthesia complications

## 2013-11-09 NOTE — H&P (Signed)
Sabrina Bray is a 27 y.o. female G3P1011 with IUP at [redacted]w[redacted]d by [redacted]w[redacted]d [place] presenting for repeat c/s. States she is having irregular contractions not very painful. No vb. No LOF. +FM.     PNCare at [facility] since 33 wks. Pt transferred care from [facility] at that time but course has been uncomplicated.  GBS not done. Labs normal. Hgb of 11.0. Last meal last night. Did have 2 sips of tea a couple hours ago.     Prenatal History/Complications:  Past Medical History: Past Medical History  Diagnosis Date  . Environmental allergies   . Heart murmur     as child - no problems  . Anxiety     no meds  . Anemia     history    Past Surgical History: Past Surgical History  Procedure Laterality Date  . Cesarean section N/A 11/03/2011    due to failed IOL at 42 weeks - [place]  . Pilonidal cyst excision N/A 02/2008, 2011    x 2 surgeries    Obstetrical History: OB History   Grav Para Term Preterm Abortions TAB SAB Ect Mult Living   3 1 1  1  1   1     G2- postdates, LTCS for failure to dilate at 8cm.  7lb60z G1- SAB G3- current.    Social History: History   Social History  . Marital Status: Single    Spouse Name: N/A    Number of Children: N/A  . Years of Education: N/A   Social History Main Topics  . Smoking status: Current Every Day Smoker -- 0.50 packs/day for 11 years    Types: Cigarettes  . Smokeless tobacco: Never Used  . Alcohol Use: No  . Drug Use: No  . Sexual Activity: Not Currently    Birth Control/ Protection: None     Comment: pregnant   Other Topics Concern  . 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 Moved to [place] from [place], [place] in August 2014.     Family History: Family History  Problem Relation Age of Onset  . Hypertension Maternal Grandmother   . Cancer Maternal Grandmother     ovarian   . Hypertension Paternal Grandmother     Allergies: No Known Allergies  No prescriptions prior to admission      Review of Systems  All systems reviewed and negative except as stated in HPI  Last menstrual period 02/09/2013. General appearance: alert, cooperative and no distress Lungs: clear to auscultation bilaterally Heart: regular rate and rhythm Abdomen: soft, non-tender; bowel sounds normal. Old c/s scar with keloid  Extremities: Homans sign is negative, no sign of DVT  Presentation: cephalic  Fetal monitoringdoppler 144     Prenatal labs: ABO, Rh: --/--/O POS, O POS (12/09 1155) Antibody: NEG (12/09 1155) Rubella:   RPR: NON REACTIVE (12/09 1155)  HBsAg:    HIV: NON REACTIVE (10/28 1106)  GBS:    1 hr Glucola 125 Genetic screening  unknown   Assessment: Sabrina Bray is a 27 y.o. G3P1011 with an IUP at [redacted]w[redacted]d presenting for scheduled repeat c/s.  Previous c/s x1.   Plan: The risks of cesarean section discussed with the patient included but were not limited to: bleeding which may require transfusion or reoperation; infection which may require antibiotics; injury to bowel, bladder, ureters or other surrounding organs; injury to the fetus; need for additional procedures including hysterectomy in the event of a life-threatening hemorrhage;  placental abnormalities wth subsequent pregnancies, incisional problems, thromboembolic phenomenon and other postoperative/anesthesia complications. The patient concurred with the proposed plan, giving informed written consent for the procedure.   Patient has been NPO since 8 pm last night. 2 sips of tea 2 hours ago.  she will remain NPO for procedure. Anesthesia and OR aware. Preoperative prophylactic antibiotics and SCDs ordered on call to the OR.  To OR when ready.     Sabrina Bray L, MD 11/09/2013, 11:22 AM

## 2013-11-10 ENCOUNTER — Encounter (HOSPITAL_COMMUNITY): Payer: Self-pay | Admitting: Obstetrics & Gynecology

## 2013-11-10 LAB — CBC
HCT: 29 % — ABNORMAL LOW (ref 36.0–46.0)
MCV: 83.3 fL (ref 78.0–100.0)
Platelets: 148 10*3/uL — ABNORMAL LOW (ref 150–400)
RBC: 3.48 MIL/uL — ABNORMAL LOW (ref 3.87–5.11)
RDW: 13.5 % (ref 11.5–15.5)
WBC: 9.1 10*3/uL (ref 4.0–10.5)

## 2013-11-10 NOTE — Anesthesia Postprocedure Evaluation (Signed)
  Anesthesia Post-op Note  Patient: Economist  Procedure(s) Performed: Procedure(s): REPEAT CESAREAN SECTION (N/A)  Patient Location: PACU and Mother/Baby  Anesthesia Type:Spinal  Level of Consciousness: awake, alert  and oriented  Airway and Oxygen Therapy: Patient Spontanous Breathing  Post-op Pain: none  Post-op Assessment: Post-op Vital signs reviewed, Patient's Cardiovascular Status Stable, No headache, No backache, No residual numbness and No residual motor weakness  Post-op Vital Signs: Reviewed and stable  Complications: No apparent anesthesia complications

## 2013-11-10 NOTE — Progress Notes (Signed)
Intermittent monitoring of O2 sat due to patients refuses to wear continuous monitor, patient education done. Emotional support given.

## 2013-11-10 NOTE — Progress Notes (Signed)
Subjective: Postpartum Day 1: Cesarean Delivery Sabrina Bray states she is doing well today.  She reports nausea post op which has resolved and her appetite is back.  She states her pain is well controlled, she is voiding urine well and has not yet passed BM/flatus.  She denies chest pain, SOB.  She is still undecided on MOC whether she wants to do Mirena or interval BTL.  She states she is breast feeding her baby girl, who is latching well, however she has not had milk letdown as of this morning.    Objective: Vital signs in last 24 hours: Temp:  [97.5 F (36.4 C)-98.4 F (36.9 C)] 98.4 F (36.9 C) (12/12 0450) Pulse Rate:  [60-87] 60 (12/12 0450) Resp:  [16-24] 18 (12/12 0450) BP: (108-133)/(61-83) 108/78 mmHg (12/12 0450) SpO2:  [97 %-100 %] 100 % (12/12 0450) Weight:  [80.287 kg (177 lb)] 80.287 kg (177 lb) (12/11 1715)  Physical Exam:  General: alert, cooperative and no distress Heart: Regular rate, rhythm.  Mid-systolic ejection murmur grade II/VI noted best heard in 2nd L ICS-- pt adds this is normal for her Lungs: CTA bilaterally Abdomen: SNT, BS active Lochia: appropriate Uterine Fundus: firm Incision: healing well, no significant drainage, erythema or signs of infection DVT Evaluation: No evidence of DVT seen on physical exam. Negative Homan's sign. No cords or calf tenderness. No significant calf/ankle edema.   Recent Labs  11/07/13 1155 11/10/13 0546  HGB 11.0* 9.9*  HCT 32.8* 29.0*    Assessment/Plan: Status post Cesarean section. Doing well postoperatively.  Continue current care.  Lactation consult  Loma Newton 11/10/2013, 8:06 AM I have seen and examined this patient and agree the above assessment. CRESENZO-DISHMAN,Sabrina Bray 11/10/2013 1:17 PM

## 2013-11-10 NOTE — Progress Notes (Signed)
Patient was referred for history of depression/anxiety.  * Referral screened out by Clinical Social Worker because none of the following criteria appear to apply:  ~ History of anxiety/depression during this pregnancy, or of post-partum depression.  ~ Diagnosis of anxiety and/or depression within last 3 years  ~ History of depression due to pregnancy loss/loss of child  OR  * Patient's symptoms currently being treated with medication and/or therapy.  Please contact the Clinical Social Worker if needs arise, or by the patient's request.  Pt experienced a panic attack before cesarean procedure last pregnancy. Denies generalized anxiety & states that was an isolated event.      

## 2013-11-11 MED ORDER — IBUPROFEN 600 MG PO TABS: 600.0000 mg | ORAL_TABLET | Freq: Four times a day (QID) | ORAL | Status: DC | PRN

## 2013-11-11 MED ORDER — OXYCODONE-ACETAMINOPHEN 5-325 MG PO TABS: 1.0000 | ORAL_TABLET | ORAL | Status: DC | PRN

## 2013-11-11 NOTE — Discharge Summary (Signed)
Obstetric Discharge Summary Reason for Admission: cesarean section- scheduled repeat Prenatal Procedures: none Intrapartum Procedures: cesarean: low cervical, transverse Postpartum Procedures: none Complications-Operative and Postpartum: none Hemoglobin  Date Value Range Status  11/10/2013 9.9* 12.0 - 15.0 g/dL Final     HCT  Date Value Range Status  11/10/2013 29.0* 36.0 - 46.0 % Final   Sabrina Bray is a 26yo G3P1011 at 39.0wks who presented for a scheduled repeat C/S. She tol the procedure well and her PP course has been uneventful. By POD#2 she is ready to go home and is deemed to have received the full benefit of her hospital stay. She is breast and bottlefeeding and is undecided between [place] and BTL for contraception method.  Physical Exam:  General: alert, cooperative and mild distress Heart: RRR Lungs: nl effort Lochia: appropriate Uterine Fundus: firm Incision: honeycomb dsg intact, marked and unchanged DVT Evaluation: No evidence of DVT seen on physical exam.  Discharge Diagnoses: Term Pregnancy-delivered  Discharge Information: Date: 11/11/2013 Activity: pelvic rest Diet: routine Medications: PNV, Ibuprofen and Percocet Condition: stable Instructions: refer to practice specific booklet Discharge to: home Follow-up Information   Follow up with [facility]. Schedule an appointment as soon as possible for a visit in 4 weeks.   Contact information:   [address] [place] [address] [phone]      Newborn Data: Live born female  Birth Weight: 6 lb 13.9 oz (3115 g) APGAR: 8, 9  Home with mother.  Cam Hai 11/11/2013, 7:44 AM

## 2013-11-14 NOTE — Progress Notes (Signed)
Post discharge chart review completed.  

## 2013-11-16 NOTE — H&P (Signed)
Pt seen and examined. The risks of cesarean section discussed with the patient included but were not limited to: bleeding which may require transfusion or reoperation; infection which may require antibiotics; injury to bowel, bladder, ureters or other surrounding organs; injury to the fetus; need for additional procedures including hysterectomy in the event of a life-threatening hemorrhage; placental abnormalities wth subsequent pregnancies, incisional problems, thromboembolic phenomenon and other postoperative/anesthesia complications. The patient concurred with the proposed plan, giving informed written consent for the procedure.

## 2013-11-16 NOTE — Op Note (Signed)
Present for entirety of procedure.  Attestation of Attending Supervision of Fellow: Evaluation and management procedures were performed by the Fellow under my supervision and collaboration. I have reviewed the Fellow's note and chart, and I agree with the operative note.  Shaine Newmark H.

## 2013-12-04 ENCOUNTER — Encounter: Payer: Self-pay | Admitting: *Deleted

## 2013-12-08 ENCOUNTER — Encounter: Payer: Self-pay | Admitting: Nurse Practitioner

## 2013-12-08 ENCOUNTER — Encounter: Payer: Self-pay | Admitting: Obstetrics & Gynecology

## 2013-12-08 ENCOUNTER — Ambulatory Visit (INDEPENDENT_AMBULATORY_CARE_PROVIDER_SITE_OTHER): Payer: Medicaid Other | Admitting: Nurse Practitioner

## 2013-12-08 VITALS — BP 135/87 | HR 73 | Temp 97.6°F | Ht 61.0 in | Wt 162.5 lb

## 2013-12-08 DIAGNOSIS — Z309 Encounter for contraceptive management, unspecified: Secondary | ICD-10-CM

## 2013-12-08 NOTE — Progress Notes (Signed)
Patient ID: Sabrina Bray, female   DOB: 10/10/1986, 28 y.o.   MRN: [id number] History:  Sabrina Bray is a [age] y.o. [id number] [facility] care and contraception management. She had C/S and has had no incision issues. She is tired otherwise doing well with baby girl. The baby is not sleeping. She has had her menses once, resumed intercourse and does not want to be on any birth control.  The following portions of the patient's history were reviewed and updated as appropriate: allergies, current medications, past family history, past medical history, past social history, past surgical history and problem list.  Review of Systems:  Pertinent items are noted in HPI.  Objective:  Physical Exam BP 135/87  Pulse 73  Temp(Src) 97.6 F (36.4 C) (Oral)  Ht 5\' 1"  (1.549 m)  Wt 162 lb 8 oz (73.71 kg)  BMI 30.72 kg/m2  LMP 12/01/2013  Breastfeeding? No GENERAL: Well-developed, well-nourished female in no acute distress.  HEENT: Normocephalic, atraumatic.  NECK: Supple. Normal thyroid.  LUNGS: Normal rate. Clear to auscultation bilaterally.  HEART: Regular rate and rhythm with no adventitious sounds.  ABDOMEN: Soft, nontender, nondistended. No organomegaly. Normal bowel sounds appreciated in all quadrants.  EXTREMITIES: No cyanosis, clubbing, or edema, 2+ distal pulses.   Labs and Imaging No results found.  Assessment & Plan:  Assessment:  Postpartum Exam Contraception  Plans:  Declines birth control Follow up exam in one year for pap smear  Delbert PhenixLinda M Ruey Storer, NP 12/08/2013 9:12 AM

## 2013-12-08 NOTE — Patient Instructions (Signed)
Contraception Choices Contraception (birth control) is the use of any methods or devices to prevent pregnancy. Below are some methods to help avoid pregnancy. HORMONAL METHODS   Contraceptive implant This is a thin, plastic tube containing progesterone hormone. It does not contain estrogen hormone. Your health care provider inserts the tube in the inner part of the upper arm. The tube can remain in place for up to 3 years. After 3 years, the implant must be removed. The implant prevents the ovaries from releasing an egg (ovulation), thickens the cervical mucus to prevent sperm from entering the uterus, and thins the lining of the inside of the uterus.  Progesterone-only injections These injections are given every 3 months by your health care provider to prevent pregnancy. This synthetic progesterone hormone stops the ovaries from releasing eggs. It also thickens cervical mucus and changes the uterine lining. This makes it harder for sperm to survive in the uterus.  Birth control pills These pills contain estrogen and progesterone hormone. They work by preventing the ovaries from releasing eggs (ovulation). They also cause the cervical mucus to thicken, preventing the sperm from entering the uterus. Birth control pills are prescribed by a health care provider.Birth control pills can also be used to treat heavy periods.  Minipill This type of birth control pill contains only the progesterone hormone. They are taken every day of each month and must be prescribed by your health care provider.  Birth control patch The patch contains hormones similar to those in birth control pills. It must be changed once a week and is prescribed by a health care provider.  Vaginal ring The ring contains hormones similar to those in birth control pills. It is left in the vagina for 3 weeks, removed for 1 week, and then a new one is put back in place. The patient must be comfortable inserting and removing the ring from the  vagina.A health care provider's prescription is necessary.  Emergency contraception Emergency contraceptives prevent pregnancy after unprotected sexual intercourse. This pill can be taken right after sex or up to 5 days after unprotected sex. It is most effective the sooner you take the pills after having sexual intercourse. Most emergency contraceptive pills are available without a prescription. Check with your pharmacist. Do not use emergency contraception as your only form of birth control. BARRIER METHODS   Female condom This is a thin sheath (latex or rubber) that is worn over the penis during sexual intercourse. It can be used with spermicide to increase effectiveness.  Female condom. This is a soft, loose-fitting sheath that is put into the vagina before sexual intercourse.  Diaphragm This is a soft, latex, dome-shaped barrier that must be fitted by a health care provider. It is inserted into the vagina, along with a spermicidal jelly. It is inserted before intercourse. The diaphragm should be left in the vagina for 6 to 8 hours after intercourse.  Cervical cap This is a round, soft, latex or plastic cup that fits over the cervix and must be fitted by a health care provider. The cap can be left in place for up to 48 hours after intercourse.  Sponge This is a soft, circular piece of polyurethane foam. The sponge has spermicide in it. It is inserted into the vagina after wetting it and before sexual intercourse.  Spermicides These are chemicals that kill or block sperm from entering the cervix and uterus. They come in the form of creams, jellies, suppositories, foam, or tablets. They do not require a   prescription. They are inserted into the vagina with an applicator before having sexual intercourse. The process must be repeated every time you have sexual intercourse. INTRAUTERINE CONTRACEPTION  Intrauterine device (IUD) This is a T-shaped device that is put in a woman's uterus during a  menstrual period to prevent pregnancy. There are 2 types:  Copper IUD This type of IUD is wrapped in copper wire and is placed inside the uterus. Copper makes the uterus and fallopian tubes produce a fluid that kills sperm. It can stay in place for 10 years.  Hormone IUD This type of IUD contains the hormone progestin (synthetic progesterone). The hormone thickens the cervical mucus and prevents sperm from entering the uterus, and it also thins the uterine lining to prevent implantation of a fertilized egg. The hormone can weaken or kill the sperm that get into the uterus. It can stay in place for 3 5 years, depending on which type of IUD is used. PERMANENT METHODS OF CONTRACEPTION  Female tubal ligation This is when the woman's fallopian tubes are surgically sealed, tied, or blocked to prevent the egg from traveling to the uterus.  Hysteroscopic sterilization This involves placing a small coil or insert into each fallopian tube. Your doctor uses a technique called hysteroscopy to do the procedure. The device causes scar tissue to form. This results in permanent blockage of the fallopian tubes, so the sperm cannot fertilize the egg. It takes about 3 months after the procedure for the tubes to become blocked. You must use another form of birth control for these 3 months.  Female sterilization This is when the female has the tubes that carry sperm tied off (vasectomy).This blocks sperm from entering the vagina during sexual intercourse. After the procedure, the man can still ejaculate fluid (semen). NATURAL PLANNING METHODS  Natural family planning This is not having sexual intercourse or using a barrier method (condom, diaphragm, cervical cap) on days the woman could become pregnant.  Calendar method This is keeping track of the length of each menstrual cycle and identifying when you are fertile.  Ovulation method This is avoiding sexual intercourse during ovulation.  Symptothermal method This is  avoiding sexual intercourse during ovulation, using a thermometer and ovulation symptoms.  Post ovulation method This is timing sexual intercourse after you have ovulated. Regardless of which type or method of contraception you choose, it is important that you use condoms to protect against the transmission of sexually transmitted infections (STIs). Talk with your health care provider about which form of contraception is most appropriate for you. Document Released: 11/16/2005 Document Revised: 07/19/2013 Document Reviewed: 05/11/2013 [employer] Patient Information 2014 [employer].  

## 2014-09-28 ENCOUNTER — Encounter: Payer: Self-pay | Admitting: General Practice

## 2014-10-01 ENCOUNTER — Encounter: Payer: Self-pay | Admitting: Nurse Practitioner

## 2016-02-03 ENCOUNTER — Emergency Department (HOSPITAL_COMMUNITY)
Admission: EM | Admit: 2016-02-03 | Discharge: 2016-02-03 | Disposition: A | Discharge status: Home / Self Care | Payer: Medicaid Other | Attending: Emergency Medicine | Admitting: Emergency Medicine

## 2016-02-03 ENCOUNTER — Encounter (HOSPITAL_COMMUNITY): Payer: Self-pay

## 2016-02-03 DIAGNOSIS — F17210 Nicotine dependence, cigarettes, uncomplicated: Secondary | ICD-10-CM | POA: Diagnosis not present

## 2016-02-03 DIAGNOSIS — N938 Other specified abnormal uterine and vaginal bleeding: Secondary | ICD-10-CM | POA: Diagnosis present

## 2016-02-03 DIAGNOSIS — Z3202 Encounter for pregnancy test, result negative: Secondary | ICD-10-CM | POA: Insufficient documentation

## 2016-02-03 DIAGNOSIS — Z862 Personal history of diseases of the blood and blood-forming organs and certain disorders involving the immune mechanism: Secondary | ICD-10-CM | POA: Diagnosis not present

## 2016-02-03 DIAGNOSIS — N946 Dysmenorrhea, unspecified: Secondary | ICD-10-CM | POA: Insufficient documentation

## 2016-02-03 DIAGNOSIS — R011 Cardiac murmur, unspecified: Secondary | ICD-10-CM | POA: Insufficient documentation

## 2016-02-03 DIAGNOSIS — Z79899 Other long term (current) drug therapy: Secondary | ICD-10-CM | POA: Diagnosis not present

## 2016-02-03 DIAGNOSIS — F419 Anxiety disorder, unspecified: Secondary | ICD-10-CM | POA: Diagnosis not present

## 2016-02-03 LAB — CBC
HCT: 40.6 % (ref 36.0–46.0)
Hemoglobin: 13.2 g/dL (ref 12.0–15.0)
MCH: 27.8 pg (ref 26.0–34.0)
MCHC: 32.5 g/dL (ref 30.0–36.0)
MCV: 85.7 fL (ref 78.0–100.0)
Platelets: 247 10*3/uL (ref 150–400)
RBC: 4.74 MIL/uL (ref 3.87–5.11)
RDW: 14 % (ref 11.5–15.5)
WBC: 7 10*3/uL (ref 4.0–10.5)

## 2016-02-03 LAB — WET PREP, GENITAL
CLUE CELLS WET PREP: NONE SEEN
Sperm: NONE SEEN
Trich, Wet Prep: NONE SEEN
YEAST WET PREP: NONE SEEN

## 2016-02-03 LAB — I-STAT BETA HCG BLOOD, ED (MC, WL, AP ONLY): I-stat hCG, quantitative: <5 m[IU]/mL (ref <5)

## 2016-02-03 LAB — HCG, QUANTITATIVE, PREGNANCY

## 2016-02-03 MED ORDER — KETOROLAC TROMETHAMINE 60 MG/2ML IM SOLN
30.0000 mg | Freq: Once | INTRAMUSCULAR | Status: AC
  Administered 2016-02-03: 30 mg via INTRAMUSCULAR
  Filled 2016-02-03: qty 2

## 2016-02-03 MED ORDER — NAPROXEN 500 MG PO TABS: 500.0000 mg | ORAL_TABLET | Freq: Two times a day (BID) | ORAL | Status: DC

## 2016-02-03 NOTE — ED Notes (Signed)
Patient reports that she had positive pregnancy home test a few weeks ago then had period. Today developed cramping and heavy vaginal bleeding with large clots, so unsure if she had miscarriage or heavy period

## 2016-02-03 NOTE — ED Provider Notes (Signed)
CSN: [id number]     Arrival date & time 02/03/16  1530 History  By signing my name below, I, Phillis Haggis, attest that this documentation has been prepared under the direction and in the presence of [facility], NP-C. Electronically Signed: Phillis Haggis, ED Scribe. 02/03/2016. 2:00 AM.  Chief Complaint  Patient presents with  . Vaginal Bleeding   Patient is a 30 y.o. female presenting with vaginal bleeding. The history is provided by the patient. No language interpreter was used.  Vaginal Bleeding Quality:  Clots Severity:  Moderate Onset quality:  Sudden Duration:  1 day Timing:  Constant Progression:  Worsening Chronicity:  New Menstrual history:  Regular Number of tampons used:  1 Possible pregnancy: no   Ineffective treatments:  None tried Associated symptoms: nausea   Associated symptoms: no dysuria and no fever   HPI Comments: Sabrina Bray is a 30 y.o. female who presents to the Emergency Department complaining of vaginal bleeding onset earlier today. Pt states that her LMP was about a month ago, but was not as heavy as her usual period. Per triage note, pt had a positive pregnancy test a few weeks ago, but then had a period. She reports waking up with vaginal bleeding this morning that worsened to clots. She was unable to control the bleeding with a tampon. She states that she heard "loud plops" in the toilet when she urinated, and was unsure if was a miscarriage. Pt has been pregnant 3 times with two full term children. She does not use BC. She reports associated nausea and frequency. She denies fever or dysuria.   Past Medical History  Diagnosis Date  . Environmental allergies   . Heart murmur     as child - no problems  . Anxiety     no meds  . Anemia     history   Past Surgical History  Procedure Laterality Date  . Cesarean section N/A 11/03/2011    due to failed IOL at 42 weeks - [place]  . Pilonidal cyst excision N/A 02/2008, 2011    x 2 surgeries  .  Cesarean section N/A 11/09/2013    Procedure: REPEAT CESAREAN SECTION;  Surgeon: Lesly Dukes, MD;  Location: [facility];  Service: Obstetrics;  Laterality: N/A;   Family History  Problem Relation Age of Onset  . Hypertension Maternal Grandmother   . Cancer Maternal Grandmother     ovarian   . Hypertension Paternal Grandmother    Social History  Substance Use Topics  . Smoking status: Current Every Day Smoker -- 0.50 packs/day for 11 years    Types: Cigarettes  . Smokeless tobacco: Never Used  . Alcohol Use: No   OB History    Gravida Para Term Preterm AB TAB SAB Ectopic Multiple Living   Review of Systems  Constitutional: Negative for fever and chills.  Gastrointestinal: Positive for nausea.  Genitourinary: Positive for frequency and vaginal bleeding. Negative for dysuria.  All other systems reviewed and are negative.  Allergies  Review of patient's allergies indicates no known allergies.  Home Medications   Prior to Admission medications   Medication Sig Start Date End Date Taking? Authorizing Provider  Docosahexaenoic Acid (PRENATAL DHA PO) Take 1 tablet by mouth daily.    Historical Provider, MD  ibuprofen (ADVIL,MOTRIN) 600 MG tablet Take 1 tablet (600 mg total) by mouth every 6 (six) hours as needed for mild pain. 11/11/13  Arabella MerlesKimberly D Shaw, CNM  naproxen (NAPROSYN) 500 MG tablet Take 1 tablet (500 mg total) by mouth 2 (two) times daily. 02/03/16   Hope Orlene OchM Neese, NP  oxyCODONE-acetaminophen (PERCOCET/ROXICET) 5-325 MG per tablet Take 1-2 tablets by mouth every 4 (four) hours as needed for severe pain (moderate - severe pain). 11/11/13   Arabella MerlesKimberly D Shaw, CNM   BP 128/64 mmHg  Pulse 63  Temp(Src) 98.4 F (36.9 C) (Oral)  Resp 16  SpO2 100%  LMP 02/02/2016 Physical Exam  Constitutional: She is oriented to person, place, and time. She appears well-developed and well-nourished.  HENT:  Head: Normocephalic and atraumatic.  Eyes: EOM are normal.  Pupils are equal, round, and reactive to light.  Neck: Normal range of motion. Neck supple.  Cardiovascular: Normal rate, regular rhythm and normal heart sounds.  Exam reveals no gallop and no friction rub.   No murmur heard. Pulmonary/Chest: Effort normal and breath sounds normal. She has no wheezes.  Abdominal: Soft. There is no tenderness.  Genitourinary:  External genitalia without lesions, small blood vaginal vault, no CMT, no adnexal tenderness, uterus without enlargement.   Musculoskeletal: Normal range of motion.  Neurological: She is alert and oriented to person, place, and time.  Skin: Skin is warm and dry.  Psychiatric: She has a normal mood and affect. Her behavior is normal.  Nursing note and vitals reviewed.   ED Course  Procedures (including critical care time) DIAGNOSTIC STUDIES: Oxygen Saturation is 99% on RA, normal by my interpretation.    COORDINATION OF CARE: 6:03 PM-Discussed treatment plan which includes labs and pelvic exam with pt at bedside and pt agreed to plan.    Labs Review Results for orders placed or performed during the hospital encounter of 02/03/16 (from the past 24 hour(s))  hCG, quantitative, pregnancy     Status: None   Collection Time: 02/03/16  3:51 PM  Result Value Ref Range   hCG, Beta Chain, Quant, S <1 <5 mIU/mL  CBC     Status: None   Collection Time: 02/03/16  3:52 PM  Result Value Ref Range   WBC 7.0 4.0 - 10.5 K/uL   RBC 4.74 3.87 - 5.11 MIL/uL   Hemoglobin 13.2 12.0 - 15.0 g/dL   HCT 40.940.6 81.136.0 - 91.446.0 %   MCV 85.7 78.0 - 100.0 fL   MCH 27.8 26.0 - 34.0 pg   MCHC 32.5 30.0 - 36.0 g/dL   RDW 78.214.0 95.611.5 - 21.315.5 %   Platelets 247 150 - 400 K/uL  I-Stat beta hCG blood, ED ([facility], [facility], [facility] only)     Status: None   Collection Time: 02/03/16  3:55 PM  Result Value Ref Range   I-stat hCG, quantitative <5.0 <5 mIU/mL   Comment 3          Wet prep, genital     Status: Abnormal   Collection Time: 02/03/16  6:57 PM  Result Value Ref Range    Yeast Wet Prep HPF POC NONE SEEN NONE SEEN   Trich, Wet Prep NONE SEEN NONE SEEN   Clue Cells Wet Prep HPF POC NONE SEEN NONE SEEN   WBC, Wet Prep HPF POC MANY (A) NONE SEEN   Sperm NONE SEEN      Imaging Review No results found. I have personally reviewed and evaluated these lab results as part of my medical decision-making.   MDM  30 y.o. female with vaginal bleeding and cramping stable for d/c with negative pregnancy test and without hemorrhage.  Will treat for dysmenorrhea and she will follow up with her GYN.   Final diagnoses:  Dysmenorrhea   I personally performed the services described in this documentation, which was scribed in my presence. The recorded information has been reviewed and is accurate.    [address] [place], NP 02/04/16 1610  Rolan Bucco, MD 02/05/16 [phone]

## 2016-02-03 NOTE — Discharge Instructions (Signed)

## 2016-02-04 LAB — GC/CHLAMYDIA PROBE AMP (~~LOC~~) NOT AT ARMC
Chlamydia: NEGATIVE
Neisseria Gonorrhea: NEGATIVE

## 2016-02-04 LAB — HIV ANTIBODY (ROUTINE TESTING W REFLEX): HIV Screen 4th Generation wRfx: NONREACTIVE

## 2016-02-04 LAB — RPR: RPR: NONREACTIVE

## 2016-05-07 ENCOUNTER — Encounter (HOSPITAL_COMMUNITY): Payer: Self-pay | Admitting: Advanced Practice Midwife

## 2016-05-07 ENCOUNTER — Inpatient Hospital Stay (HOSPITAL_COMMUNITY)
Admission: AD | Admit: 2016-05-07 | Discharge: 2016-05-07 | Disposition: A | Discharge status: Home / Self Care | Payer: Medicaid Other | Source: Ambulatory Visit | Attending: Obstetrics & Gynecology | Admitting: Obstetrics & Gynecology

## 2016-05-07 ENCOUNTER — Inpatient Hospital Stay (HOSPITAL_COMMUNITY): Payer: Medicaid Other

## 2016-05-07 DIAGNOSIS — B3731 Acute candidiasis of vulva and vagina: Secondary | ICD-10-CM

## 2016-05-07 DIAGNOSIS — N760 Acute vaginitis: Secondary | ICD-10-CM

## 2016-05-07 DIAGNOSIS — O98812 Other maternal infectious and parasitic diseases complicating pregnancy, second trimester: Secondary | ICD-10-CM | POA: Diagnosis not present

## 2016-05-07 DIAGNOSIS — B373 Candidiasis of vulva and vagina: Secondary | ICD-10-CM | POA: Diagnosis not present

## 2016-05-07 DIAGNOSIS — O23591 Infection of other part of genital tract in pregnancy, first trimester: Secondary | ICD-10-CM | POA: Diagnosis not present

## 2016-05-07 DIAGNOSIS — O208 Other hemorrhage in early pregnancy: Secondary | ICD-10-CM | POA: Diagnosis present

## 2016-05-07 DIAGNOSIS — B9689 Other specified bacterial agents as the cause of diseases classified elsewhere: Secondary | ICD-10-CM

## 2016-05-07 DIAGNOSIS — Z3A01 Less than 8 weeks gestation of pregnancy: Secondary | ICD-10-CM | POA: Diagnosis not present

## 2016-05-07 DIAGNOSIS — O98811 Other maternal infectious and parasitic diseases complicating pregnancy, first trimester: Secondary | ICD-10-CM

## 2016-05-07 DIAGNOSIS — O468X1 Other antepartum hemorrhage, first trimester: Secondary | ICD-10-CM

## 2016-05-07 DIAGNOSIS — O209 Hemorrhage in early pregnancy, unspecified: Secondary | ICD-10-CM

## 2016-05-07 DIAGNOSIS — O418X10 Other specified disorders of amniotic fluid and membranes, first trimester, not applicable or unspecified: Secondary | ICD-10-CM

## 2016-05-07 LAB — URINE MICROSCOPIC-ADD ON: RBC / HPF: NONE SEEN RBC/hpf (ref 0–5)

## 2016-05-07 LAB — POCT PREGNANCY, URINE: Preg Test, Ur: POSITIVE — AB

## 2016-05-07 LAB — CBC
HCT: 38.8 % (ref 36.0–46.0)
HEMOGLOBIN: 13 g/dL (ref 12.0–15.0)
MCH: 27.9 pg (ref 26.0–34.0)
MCHC: 33.5 g/dL (ref 30.0–36.0)
MCV: 83.3 fL (ref 78.0–100.0)
Platelets: 266 10*3/uL (ref 150–400)
RBC: 4.66 MIL/uL (ref 3.87–5.11)
RDW: 15 % (ref 11.5–15.5)
WBC: 9.3 10*3/uL (ref 4.0–10.5)

## 2016-05-07 LAB — WET PREP, GENITAL
SPERM: NONE SEEN
TRICH WET PREP: NONE SEEN

## 2016-05-07 LAB — ABO/RH: ABO/RH(D): O POS

## 2016-05-07 LAB — HCG, QUANTITATIVE, PREGNANCY: hCG, Beta Chain, Quant, S: 9585 m[IU]/mL — ABNORMAL HIGH (ref <5)

## 2016-05-07 LAB — URINALYSIS, ROUTINE W REFLEX MICROSCOPIC
Bilirubin Urine: NEGATIVE
GLUCOSE, UA: NEGATIVE mg/dL
HGB URINE DIPSTICK: NEGATIVE
KETONES UR: NEGATIVE mg/dL
Nitrite: NEGATIVE
PROTEIN: NEGATIVE mg/dL
Specific Gravity, Urine: 1.02 (ref 1.005–1.030)
pH: 6 (ref 5.0–8.0)

## 2016-05-07 MED ORDER — METRONIDAZOLE 500 MG PO TABS: 500.0000 mg | ORAL_TABLET | Freq: Two times a day (BID) | ORAL | Status: DC

## 2016-05-07 MED ORDER — MICONAZOLE NITRATE 2 % VA CREA: 1.0000 | TOPICAL_CREAM | Freq: Every day | VAGINAL | Status: DC

## 2016-05-07 NOTE — MAU Provider Note (Signed)
Chief Complaint: Vaginal Bleeding and Abdominal Cramping   First Provider Initiated Contact with Patient 05/07/16 2136     SUBJECTIVE HPI: Sabrina Bray is a 30 y.o. [id number] at [redacted]w[redacted]d who presents to Maternity Admissions reporting spotting and cramping today.   Location: Suprapubic Quality: cramping Severity: Moderate Duration: <24 hours Course: unchanged Context: none Timing: intermittent Modifying factors: none. Hasn't tried anything to Tx the pain Associated signs and symptoms: Pos for spotting, nausea and constipation. Neg for fever, chills, passage of tissue or clots, vomiting, urinary complaints.   Past Medical History  Diagnosis Date  . Environmental allergies   . Heart murmur     as child - no problems  . Anxiety     no meds  . Anemia     history   OB History  Gravida Para Term Preterm AB SAB TAB Ectopic Multiple Living  4 2 2  1 1    2     # Outcome Date GA Lbr Len/2nd Weight Sex Delivery Anes PTL Lv  4 Current           3 Term 11/09/13 [redacted]w[redacted]d  6 lb 13.9 oz (3.115 kg) F CS-LTranv Spinal  Y  2 Term 11/03/11 [redacted]w[redacted]d  7 lb 6 oz (3.345 kg) M CS-LTranv Spinal N Y  1 SAB 04/2008             Past Surgical History  Procedure Laterality Date  . Cesarean section N/A 11/03/2011    due to failed IOL at 42 weeks - [place]  . Pilonidal cyst excision N/A 02/2008, 2011    x 2 surgeries  . Cesarean section N/A 11/09/2013    Procedure: REPEAT CESAREAN SECTION;  Surgeon: Lesly Dukes, MD;  Location: [facility];  Service: Obstetrics;  Laterality: N/A;   Social History   Social History  . Marital Status: Single    Spouse Name: N/A  . Number of Children: N/A  . Years of Education: N/A   Occupational History  . Not on file.   Social History Main Topics  . Smoking status: Current Every Day Smoker -- 0.50 packs/day for 11 years    Types: Cigarettes  . Smokeless tobacco: Never Used  . Alcohol Use: No  . Drug Use: No  . Sexual Activity: Not Currently    Birth Control/  Protection: None     Comment: pregnant   Other Topics Concern  . 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 Moved to [place] from [employer] [place], [place] in August 2014.    No current facility-administered medications on file prior to encounter.   Current Outpatient Prescriptions on File Prior to Encounter  Medication Sig Dispense Refill  . Docosahexaenoic Acid (PRENATAL DHA PO) Take 1 tablet by mouth daily.    Marland Kitchen ibuprofen (ADVIL,MOTRIN) 600 MG tablet Take 1 tablet (600 mg total) by mouth every 6 (six) hours as needed for mild pain. 50 tablet 1  . naproxen (NAPROSYN) 500 MG tablet Take 1 tablet (500 mg total) by mouth 2 (two) times daily. 30 tablet 0  . oxyCODONE-acetaminophen (PERCOCET/ROXICET) 5-325 MG per tablet Take 1-2 tablets by mouth every 4 (four) hours as needed for severe pain (moderate - severe pain). 50 tablet 0   No Known Allergies  I have reviewed the past Medical Hx, Surgical Hx, Social Hx, Allergies and Medications.   Review of Systems  Constitutional: Positive for unexpected weight change. Negative for fever and chills.  Gastrointestinal: Positive  for nausea, abdominal pain and constipation. Negative for vomiting, diarrhea and abdominal distention.  Genitourinary: Positive for vaginal bleeding. Negative for dysuria, hematuria, vaginal discharge and vaginal pain.  Neurological: Negative for dizziness.    OBJECTIVE Patient Vitals for the past 24 hrs:  BP Temp Pulse Resp Height Weight  05/07/16 1730 125/74 mmHg 98.8 F (37.1 C) 90 18 5\' 3"  (1.6 m) 178 lb 12.8 oz (81.103 kg)   Constitutional: Well-developed, well-nourished female in no acute distress.  Cardiovascular: normal rate Respiratory: normal rate and effort.  GI: Abd soft, non-tender. Pos BS x 4 MS: Extremities nontender, no edema, normal ROM Neurologic: Alert and oriented x 4.  GU: Neg CVAT.  SPECULUM EXAM: NEFG, small amount of thick, white, odorless, curd-like discharge, no  blood noted, cervix clean  BIMANUAL: cervix closed; uterus top-normal size, no adnexal tenderness or masses. No CMT.  LAB RESULTS Results for orders placed or performed during the hospital encounter of 05/07/16 (from the past 24 hour(s))  Urinalysis, Routine w reflex microscopic (not at [facility])     Status: Abnormal   Collection Time: 05/07/16  5:30 PM  Result Value Ref Range   Color, Urine YELLOW YELLOW   APPearance HAZY (A) CLEAR   Specific Gravity, Urine 1.020 1.005 - 1.030   pH 6.0 5.0 - 8.0   Glucose, UA NEGATIVE NEGATIVE mg/dL   Hgb urine dipstick NEGATIVE NEGATIVE   Bilirubin Urine NEGATIVE NEGATIVE   Ketones, ur NEGATIVE NEGATIVE mg/dL   Protein, ur NEGATIVE NEGATIVE mg/dL   Nitrite NEGATIVE NEGATIVE   Leukocytes, UA SMALL (A) NEGATIVE  Urine microscopic-add on     Status: Abnormal   Collection Time: 05/07/16  5:30 PM  Result Value Ref Range   Squamous Epithelial / LPF 6-30 (A) NONE SEEN   WBC, UA 6-30 0 - 5 WBC/hpf   RBC / HPF NONE SEEN 0 - 5 RBC/hpf   Bacteria, UA RARE (A) NONE SEEN  Pregnancy, urine POC     Status: Abnormal   Collection Time: 05/07/16  5:39 PM  Result Value Ref Range   Preg Test, Ur POSITIVE (A) NEGATIVE  CBC     Status: None   Collection Time: 05/07/16  7:07 PM  Result Value Ref Range   WBC 9.3 4.0 - 10.5 K/uL   RBC 4.66 3.87 - 5.11 MIL/uL   Hemoglobin 13.0 12.0 - 15.0 g/dL   HCT 81.1 91.4 - 78.2 %   MCV 83.3 78.0 - 100.0 fL   MCH 27.9 26.0 - 34.0 pg   MCHC 33.5 30.0 - 36.0 g/dL   RDW 95.6 21.3 - 08.6 %   Platelets 266 150 - 400 K/uL  ABO/Rh     Status: None   Collection Time: 05/07/16  7:07 PM  Result Value Ref Range   ABO/RH(D) O POS   hCG, quantitative, pregnancy     Status: Abnormal   Collection Time: 05/07/16  7:07 PM  Result Value Ref Range   hCG, Beta Chain, Quant, S 9585 (H) <5 mIU/mL  Wet prep, genital     Status: Abnormal   Collection Time: 05/07/16  9:44 PM  Result Value Ref Range   Yeast Wet Prep HPF POC PRESENT (A) NONE SEEN    Trich, Wet Prep NONE SEEN NONE SEEN   Clue Cells Wet Prep HPF POC PRESENT (A) NONE SEEN   WBC, Wet Prep HPF POC MANY (A) NONE SEEN   Sperm NONE SEEN     IMAGING US Ob Comp Less 14 Wks  05/07/2016  CLINICAL DATA:  Vaginal bleeding. EXAM: OBSTETRIC <14 WK US AND TRANSVAGINAL OB US TECHNIQUE: Both transabdominal and transvaginal ultrasound examinations were performed for complete evaluation of the gestation as well as the maternal uterus, adnexal regions, and pelvic cul-de-sac. Transvaginal technique was performed to assess early pregnancy. COMPARISON:  None. FINDINGS: Intrauterine gestational sac: Single Yolk sac:  Present Embryo:  Not present Cardiac Activity: Not present MSD: 10.5  mm   5 w   6  d Subchorionic hemorrhage:  Small subchorionic hemorrhage. Maternal uterus/adnexae: No pelvic free fluid. 2.1 x 1.9 x 2.2 cm hypoechoic left ovarian mass with peripheral Doppler flow most consistent with a corpus luteum cyst. Otherwise normal ovaries. IMPRESSION: Probable early intrauterine gestational sac and yolk sac, but no fetal pole or cardiac activity yet visualized. Recommend follow-up quantitative B-HCG levels and follow-up US in 14 days to confirm and assess viability. This recommendation follows SRU consensus guidelines: Diagnostic Criteria for Nonviable Pregnancy Early in the First Trimester. Malva Limes Engl J Med 2013; 782:9562-13; 369:1443-51. Small subchorionic hemorrhage. Electronically Signed   By: Elige KoHetal  Patel   On: 05/07/2016 21:22   [place] Ob Transvaginal  05/07/2016  CLINICAL DATA:  Vaginal bleeding. EXAM: OBSTETRIC <14 WK US AND TRANSVAGINAL OB US TECHNIQUE: Both transabdominal and transvaginal ultrasound examinations were performed for complete evaluation of the gestation as well as the maternal uterus, adnexal regions, and pelvic cul-de-sac. Transvaginal technique was performed to assess early pregnancy. COMPARISON:  None. FINDINGS: Intrauterine gestational sac: Single Yolk sac:  Present Embryo:  Not present Cardiac  Activity: Not present MSD: 10.5  mm   5 w   6  d Subchorionic hemorrhage:  Small subchorionic hemorrhage. Maternal uterus/adnexae: No pelvic free fluid. 2.1 x 1.9 x 2.2 cm hypoechoic left ovarian mass with peripheral Doppler flow most consistent with a corpus luteum cyst. Otherwise normal ovaries. IMPRESSION: Probable early intrauterine gestational sac and yolk sac, but no fetal pole or cardiac activity yet visualized. Recommend follow-up quantitative B-HCG levels and follow-up US in 14 days to confirm and assess viability. This recommendation follows SRU consensus guidelines: Diagnostic Criteria for Nonviable Pregnancy Early in the First Trimester. Malva Limes Engl J Med 2013; 086:5784-69; 369:1443-51. Small subchorionic hemorrhage. Electronically Signed   By: Elige KoHetal  Patel   On: 05/07/2016 21:22    MAU COURSE CBC, Quant, ABO/Rh, ultrasound, wet prep and GC/chlamydia culture, UA  MDM - Pain and bleeding in early pregnancy with normal intrauterine pregnancy and hemodynamically stable. [facility], [facility] and yeast.   ASSESSMENT 1. Subchorionic hemorrhage in first trimester   2. Vaginal bleeding in pregnancy, first trimester   3. BV (bacterial vaginosis)   4. Vaginal yeast infection     PLAN Discharge home in stable condition. Bleeding precautions Pelvic rest x 1 week     Follow-up Information    [facility].   Specialty:  Obstetrics and Gynecology   Why:  Start prenatal care   Contact information:   [address] [place] [id number] [phone]      [facility].   Why:  As needed if symptoms worsen   Contact information:   [address] [id number] [place] [id number] [phone]       Medication List    TAKE these medications        ibuprofen 600 MG tablet  Commonly known as:  ADVIL,MOTRIN  Take 1 tablet (600 mg total)  by mouth every 6 (six) hours as  needed for mild pain.     metroNIDAZOLE 500 MG tablet  Commonly known as:  FLAGYL  Take 1 tablet (500 mg total) by mouth 2 (two) times daily.     miconazole 2 % vaginal cream  Commonly known as:  MONISTAT 7  Place 1 Applicatorful vaginally at bedtime.     naproxen 500 MG tablet  Commonly known as:  NAPROSYN  Take 1 tablet (500 mg total) by mouth 2 (two) times daily.     oxyCODONE-acetaminophen 5-325 MG tablet  Commonly known as:  PERCOCET/ROXICET  Take 1-2 tablets by mouth every 4 (four) hours as needed for severe pain (moderate - severe pain).     PRENATAL DHA PO  Take 1 tablet by mouth daily.         [place], CNM 05/07/2016  10:09 PM  4

## 2016-05-07 NOTE — Discharge Instructions (Signed)
Subchorionic Hematoma/Hemorrhage A subchorionic hematoma is a gathering of blood between the outer wall of the placenta and the inner wall of the womb (uterus). The placenta is the organ that connects the fetus to the wall of the uterus. The placenta performs the feeding, breathing (oxygen to the fetus), and waste removal (excretory work) of the fetus.  Subchorionic hematoma is the most common abnormality found on a result from ultrasonography done during the first trimester or early second trimester of pregnancy. If there has been little or no vaginal bleeding, early small hematomas usually shrink on their own and do not affect your baby or pregnancy. The blood is gradually absorbed over 1-2 weeks. When bleeding starts later in pregnancy or the hematoma is larger or occurs in an older pregnant woman, the outcome may not be as good. Larger hematomas may get bigger, which increases the chances for miscarriage. Subchorionic hematoma also increases the risk of premature detachment of the placenta from the uterus, preterm (premature) labor, and stillbirth. HOME CARE INSTRUCTIONS  Stay on bed rest if your health care provider recommends this. Although bed rest will not prevent more bleeding or prevent a miscarriage, your health care provider may recommend bed rest until you are advised otherwise.  Avoid heavy lifting (more than 10 lb [4.5 kg]), exercise, sexual intercourse, or douching as directed by your health care provider.  Keep track of the number of pads you use each day and how soaked (saturated) they are. Write down this information.  Do not use tampons.  Keep all follow-up appointments as directed by your health care provider. Your health care provider may ask you to have follow-up blood tests or ultrasound tests or both. SEEK IMMEDIATE MEDICAL CARE IF:  You have severe cramps in your stomach, back, abdomen, or pelvis.  You have a fever.  You pass large clots or tissue. Save any tissue for  your health care provider to look at.  Your bleeding increases or you become lightheaded, feel weak, or have fainting episodes.   This information is not intended to replace advice given to you by your health care provider. Make sure you discuss any questions you have with your health care provider.   Document Released: 03/03/2007 Document Revised: 12/07/2014 Document Reviewed: 06/15/2013 Elsevier Interactive Patient Education 2016 Elsevier Inc.   Bacterial Vaginosis Bacterial vaginosis is a vaginal infection that occurs when the normal balance of bacteria in the vagina is disrupted. It results from an overgrowth of certain bacteria. This is the most common vaginal infection in women of childbearing age. Treatment is important to prevent complications, especially in pregnant women, as it can cause a premature delivery. CAUSES  Bacterial vaginosis is caused by an increase in harmful bacteria that are normally present in smaller amounts in the vagina. Several different kinds of bacteria can cause bacterial vaginosis. However, the reason that the condition develops is not fully understood. RISK FACTORS Certain activities or behaviors can put you at an increased risk of developing bacterial vaginosis, including:  Having a new sex partner or multiple sex partners.  Douching.  Using an intrauterine device (IUD) for contraception. Women do not get bacterial vaginosis from toilet seats, bedding, swimming pools, or contact with objects around them. SIGNS AND SYMPTOMS  Some women with bacterial vaginosis have no signs or symptoms. Common symptoms include:  Grey vaginal discharge.  A fishlike odor with discharge, especially after sexual intercourse.  Itching or burning of the vagina and vulva.  Burning or pain with urination. DIAGNOSIS  Your health care provider will take a medical history and examine the vagina for signs of bacterial vaginosis. A sample of vaginal fluid may be taken. Your  health care provider will look at this sample under a microscope to check for bacteria and abnormal cells. A vaginal pH test may also be done.  TREATMENT  Bacterial vaginosis may be treated with antibiotic medicines. These may be given in the form of a pill or a vaginal cream. A second round of antibiotics may be prescribed if the condition comes back after treatment. Because bacterial vaginosis increases your risk for sexually transmitted diseases, getting treated can help reduce your risk for chlamydia, gonorrhea, HIV, and herpes. HOME CARE INSTRUCTIONS   Only take over-the-counter or prescription medicines as directed by your health care provider.  If antibiotic medicine was prescribed, take it as directed. Make sure you finish it even if you start to feel better.  Tell all sexual partners that you have a vaginal infection. They should see their health care provider and be treated if they have problems, such as a mild rash or itching.  During treatment, it is important that you follow these instructions:  Avoid sexual activity or use condoms correctly.  Do not douche.  Avoid alcohol as directed by your health care provider.  Avoid breastfeeding as directed by your health care provider. SEEK MEDICAL CARE IF:   Your symptoms are not improving after 3 days of treatment.  You have increased discharge or pain.  You have a fever. MAKE SURE YOU:   Understand these instructions.  Will watch your condition.  Will get help right away if you are not doing well or get worse. FOR MORE INFORMATION  Centers for Disease Control and Prevention, Division of STD Prevention: SolutionApps.co.za American Sexual Health Association (ASHA): www.ashastd.org    This information is not intended to replace advice given to you by your health care provider. Make sure you discuss any questions you have with your health care provider.   Document Released: 11/16/2005 Document Revised: 12/07/2014 Document  Reviewed: 06/28/2013 Elsevier Interactive Patient Education 2016 Elsevier Inc.  Monilial Vaginitis Vaginitis in a soreness, swelling and redness (inflammation) of the vagina and vulva. Monilial vaginitis is not a sexually transmitted infection. CAUSES  Yeast vaginitis is caused by yeast (candida) that is normally found in your vagina. With a yeast infection, the candida has overgrown in number to a point that upsets the chemical balance. SYMPTOMS   White, thick vaginal discharge.  Swelling, itching, redness and irritation of the vagina and possibly the lips of the vagina (vulva).  Burning or painful urination.  Painful intercourse. DIAGNOSIS  Things that may contribute to monilial vaginitis are:  Postmenopausal and virginal states.  Pregnancy.  Infections.  Being tired, sick or stressed, especially if you had monilial vaginitis in the past.  Diabetes. Good control will help lower the chance.  Birth control pills.  Tight fitting garments.  Using bubble bath, feminine sprays, douches or deodorant tampons.  Taking certain medications that kill germs (antibiotics).  Sporadic recurrence can occur if you become ill. TREATMENT  Your caregiver will give you medication.  There are several kinds of anti monilial vaginal creams and suppositories specific for monilial vaginitis. For recurrent yeast infections, use a suppository or cream in the vagina 2 times a week, or as directed.  Anti-monilial or steroid cream for the itching or irritation of the vulva may also be used. Get your caregiver's permission.  Painting the vagina with methylene blue solution  may help if the monilial cream does not work.  Eating yogurt may help prevent monilial vaginitis. HOME CARE INSTRUCTIONS   Finish all medication as prescribed.  Do not have sex until treatment is completed or after your caregiver tells you it is okay.  Take warm sitz baths.  Do not douche.  Do not use tampons,  especially scented ones.  Wear cotton underwear.  Avoid tight pants and panty hose.  Tell your sexual partner that you have a yeast infection. They should go to their caregiver if they have symptoms such as mild rash or itching.  Your sexual partner should be treated as well if your infection is difficult to eliminate.  Practice safer sex. Use condoms.  Some vaginal medications cause latex condoms to fail. Vaginal medications that harm condoms are:  Cleocin cream.  Butoconazole (Femstat).  Terconazole (Terazol) vaginal suppository.  Miconazole (Monistat) (may be purchased over the counter). SEEK MEDICAL CARE IF:   You have a temperature by mouth above 102 F (38.9 C).  The infection is getting worse after 2 days of treatment.  The infection is not getting better after 3 days of treatment.  You develop blisters in or around your vagina.  You develop vaginal bleeding, and it is not your menstrual period.  You have pain when you urinate.  You develop intestinal problems.  You have pain with sexual intercourse.   This information is not intended to replace advice given to you by your health care provider. Make sure you discuss any questions you have with your health care provider.   Document Released: 08/26/2005 Document Revised: 02/08/2012 Document Reviewed: 05/20/2015 Elsevier Interactive Patient Education [employer]

## 2016-05-07 NOTE — MAU Note (Signed)
Pt reports having some vag bleeding when she wipes and some cramping. Had a positive HPT. LMP 03/30/16

## 2016-05-07 NOTE — MAU Note (Signed)
Pt not lobby

## 2016-05-08 LAB — GC/CHLAMYDIA PROBE AMP (~~LOC~~) NOT AT ARMC
CHLAMYDIA, DNA PROBE: NEGATIVE
NEISSERIA GONORRHEA: NEGATIVE

## 2016-05-08 LAB — HIV ANTIBODY (ROUTINE TESTING W REFLEX): HIV SCREEN 4TH GENERATION: NONREACTIVE

## 2016-06-12 ENCOUNTER — Inpatient Hospital Stay (HOSPITAL_COMMUNITY)
Admission: AD | Admit: 2016-06-12 | Discharge: 2016-06-13 | Disposition: A | Discharge status: Home / Self Care | Payer: Medicaid Other | Source: Ambulatory Visit | Attending: Obstetrics & Gynecology | Admitting: Obstetrics & Gynecology

## 2016-06-12 DIAGNOSIS — O99331 Smoking (tobacco) complicating pregnancy, first trimester: Secondary | ICD-10-CM | POA: Diagnosis not present

## 2016-06-12 DIAGNOSIS — K5900 Constipation, unspecified: Secondary | ICD-10-CM | POA: Diagnosis not present

## 2016-06-12 DIAGNOSIS — F17210 Nicotine dependence, cigarettes, uncomplicated: Secondary | ICD-10-CM | POA: Insufficient documentation

## 2016-06-12 DIAGNOSIS — R109 Unspecified abdominal pain: Secondary | ICD-10-CM

## 2016-06-12 DIAGNOSIS — O9989 Other specified diseases and conditions complicating pregnancy, childbirth and the puerperium: Secondary | ICD-10-CM | POA: Diagnosis not present

## 2016-06-12 DIAGNOSIS — O26899 Other specified pregnancy related conditions, unspecified trimester: Secondary | ICD-10-CM

## 2016-06-12 DIAGNOSIS — O26891 Other specified pregnancy related conditions, first trimester: Secondary | ICD-10-CM | POA: Insufficient documentation

## 2016-06-12 DIAGNOSIS — Z3A10 10 weeks gestation of pregnancy: Secondary | ICD-10-CM | POA: Diagnosis not present

## 2016-06-12 DIAGNOSIS — O99611 Diseases of the digestive system complicating pregnancy, first trimester: Secondary | ICD-10-CM | POA: Insufficient documentation

## 2016-06-12 DIAGNOSIS — R1031 Right lower quadrant pain: Secondary | ICD-10-CM | POA: Diagnosis present

## 2016-06-13 ENCOUNTER — Encounter (HOSPITAL_COMMUNITY): Payer: Self-pay | Admitting: *Deleted

## 2016-06-13 DIAGNOSIS — O9989 Other specified diseases and conditions complicating pregnancy, childbirth and the puerperium: Secondary | ICD-10-CM

## 2016-06-13 DIAGNOSIS — R109 Unspecified abdominal pain: Secondary | ICD-10-CM

## 2016-06-13 LAB — CBC WITH DIFFERENTIAL/PLATELET
BASOS ABS: 0 10*3/uL (ref 0.0–0.1)
Basophils Relative: 0 %
Eosinophils Absolute: 0.1 10*3/uL (ref 0.0–0.7)
Eosinophils Relative: 1 %
HEMATOCRIT: 34.3 % — AB (ref 36.0–46.0)
Hemoglobin: 12.4 g/dL (ref 12.0–15.0)
LYMPHS ABS: 2.9 10*3/uL (ref 0.7–4.0)
LYMPHS PCT: 33 %
MCH: 30 pg (ref 26.0–34.0)
MCHC: 36.2 g/dL — ABNORMAL HIGH (ref 30.0–36.0)
MCV: 82.9 fL (ref 78.0–100.0)
MONO ABS: 0.5 10*3/uL (ref 0.1–1.0)
MONOS PCT: 6 %
NEUTROS ABS: 5.3 10*3/uL (ref 1.7–7.7)
Neutrophils Relative %: 60 %
Platelets: 212 10*3/uL (ref 150–400)
RBC: 4.14 MIL/uL (ref 3.87–5.11)
RDW: 15.1 % (ref 11.5–15.5)
WBC: 8.7 10*3/uL (ref 4.0–10.5)

## 2016-06-13 LAB — URINALYSIS, ROUTINE W REFLEX MICROSCOPIC
Bilirubin Urine: NEGATIVE
Glucose, UA: NEGATIVE mg/dL
HGB URINE DIPSTICK: NEGATIVE
Ketones, ur: 15 mg/dL — AB
Leukocytes, UA: NEGATIVE
NITRITE: NEGATIVE
PH: 5.5 (ref 5.0–8.0)
PROTEIN: NEGATIVE mg/dL
Specific Gravity, Urine: 1.025 (ref 1.005–1.030)

## 2016-06-13 MED ORDER — DOXYLAMINE-PYRIDOXINE 10-10 MG PO TBEC: 1.0000 | DELAYED_RELEASE_TABLET | Freq: Every day | ORAL | Status: DC

## 2016-06-13 NOTE — MAU Note (Signed)
Urine sent to lab 

## 2016-06-13 NOTE — MAU Note (Signed)
Patient presents at [redacted] weeks gestation with c/o right sided cramping. Denies bleeding or discharge.

## 2016-06-13 NOTE — MAU Provider Note (Signed)
Chief Complaint: Flank Pain   First Provider Initiated Contact with Patient 06/13/16 0033        SUBJECTIVE HPI: Sabrina Bray is a 30 y.o. [id number] at [redacted]w[redacted]d by LMP who presents to maternity admissions reporting pain on right lower side of abdomen.  Denies bleeding or other problems except she does have some constipation.  Has been seen recently for cramping and spotting (05/07/16).  Was treated for BV at that time. SIUP with yolk sac seen (no embryo).  She denies vaginal bleeding, vaginal itching/burning, urinary symptoms, h/a, dizziness, n/v, or fever/chills.     Abdominal Cramping This is a recurrent problem. The current episode started today. The onset quality is gradual. The problem occurs intermittently. The problem has been unchanged. The pain is located in the LLQ and RLQ. The quality of the pain is cramping. The abdominal pain does not radiate. Associated symptoms include constipation. Pertinent negatives include no diarrhea, dysuria, fever, frequency, myalgias, nausea or vomiting. Nothing aggravates the pain. The pain is relieved by nothing. She has tried nothing for the symptoms.   RN Note: Patient presents at [redacted] weeks gestation with c/o right sided cramping. Denies bleeding or discharge.           Past Medical History  Diagnosis Date  . Environmental allergies   . Heart murmur     as child - no problems  . Anxiety     no meds  . Anemia     history   Past Surgical History  Procedure Laterality Date  . Cesarean section N/A 11/03/2011    due to failed IOL at 42 weeks - [place]  . Pilonidal cyst excision N/A 02/2008, 2011    x 2 surgeries  . Cesarean section N/A 11/09/2013    Procedure: REPEAT CESAREAN SECTION;  Surgeon: Lesly Dukes, MD;  Location: [facility];  Service: Obstetrics;  Laterality: N/A;   Social History   Social History  . Marital Status: Single    Spouse Name: N/A  . Number of Children: N/A  . Years of Education: N/A   Occupational History  .  Not on file.   Social History Main Topics  . Smoking status: Current Every Day Smoker -- 0.50 packs/day for 11 years    Types: Cigarettes  . Smokeless tobacco: Never Used  . Alcohol Use: No  . Drug Use: No  . Sexual Activity: Not Currently    Birth Control/ Protection: None     Comment: pregnant   Other Topics Concern  . 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 Moved to [place] from [place], [place] in August 2014.    No current facility-administered medications on file prior to encounter.   Current Outpatient Prescriptions on File Prior to Encounter  Medication Sig Dispense Refill  . Docosahexaenoic Acid (PRENATAL DHA PO) Take 1 tablet by mouth daily.    Marland Kitchen ibuprofen (ADVIL,MOTRIN) 600 MG tablet Take 1 tablet (600 mg total) by mouth every 6 (six) hours as needed for mild pain. 50 tablet 1  . metroNIDAZOLE (FLAGYL) 500 MG tablet Take 1 tablet (500 mg total) by mouth 2 (two) times daily. 14 tablet 0  . miconazole (MONISTAT 7) 2 % vaginal cream Place 1 Applicatorful vaginally at bedtime. 45 g 0  . naproxen (NAPROSYN) 500 MG tablet Take 1 tablet (500 mg total) by mouth 2 (two) times daily. 30 tablet 0  . oxyCODONE-acetaminophen (PERCOCET/ROXICET) 5-325 MG per tablet Take 1-2 tablets  by mouth every 4 (four) hours as needed for severe pain (moderate - severe pain). 50 tablet 0   No Known Allergies  I have reviewed patient's Past Medical Hx, Surgical Hx, Family Hx, Social Hx, medications and allergies.   ROS:  Review of Systems  Constitutional: Negative for fever.  Gastrointestinal: Positive for constipation. Negative for nausea, vomiting and diarrhea.  Genitourinary: Negative for dysuria, frequency, flank pain, vaginal bleeding and difficulty urinating.  Musculoskeletal: Negative for myalgias.    Other systems negative  Physical Exam  Patient Vitals for the past 24 hrs:  BP Temp Temp src Pulse Resp Height Weight  06/13/16 0005 125/74 mmHg 98.8  F (37.1 C) Oral 101 18  (1.6 m) 178 lb 12 oz (81.08 kg)   \ Physical Exam  Constitutional: Well-developed, well-nourished female in no acute distress.  Cardiovascular: normal rate Respiratory: normal effort GI: Abd soft, slightly tender RLQ. Pos BS x 4 MS: Extremities nontender, no edema, normal ROM Neurologic: Alert and oriented x 4.  GU: Neg CVAT.  PELVIC EXAM: Cervix pink, visually closed, without lesion, scant white creamy discharge, vaginal walls and external genitalia normal Bimanual exam: Cervix 0/long/high, firm, anterior, neg CMT, uterus nontender, nonenlarged, adnexa without tenderness, enlargement, or mass  FHT 158 by doppler  LAB RESULTS Results for orders placed or performed during the hospital encounter of 06/12/16 (from the past 72 hour(s))  Urinalysis, Routine w reflex microscopic (not at [facility])     Status: Abnormal   Collection Time: 06/12/16 11:52 PM  Result Value Ref Range   Color, Urine YELLOW YELLOW   APPearance CLEAR CLEAR   Specific Gravity, Urine 1.025 1.005 - 1.030   pH 5.5 5.0 - 8.0   Glucose, UA NEGATIVE NEGATIVE mg/dL   Hgb urine dipstick NEGATIVE NEGATIVE   Bilirubin Urine NEGATIVE NEGATIVE   Ketones, ur 15 (A) NEGATIVE mg/dL   Protein, ur NEGATIVE NEGATIVE mg/dL   Nitrite NEGATIVE NEGATIVE   Leukocytes, UA NEGATIVE NEGATIVE    Comment: MICROSCOPIC NOT DONE ON URINES WITH NEGATIVE PROTEIN, BLOOD, LEUKOCYTES, NITRITE, OR GLUCOSE <1000 mg/dL.  CBC with Differential     Status: Abnormal   Collection Time: 06/13/16 12:50 AM  Result Value Ref Range   WBC 8.7 4.0 - 10.5 K/uL   RBC 4.14 3.87 - 5.11 MIL/uL   Hemoglobin 12.4 12.0 - 15.0 g/dL   HCT 13.0 (L) 86.5 - 78.4 %   MCV 82.9 78.0 - 100.0 fL   MCH 30.0 26.0 - 34.0 pg   MCHC 36.2 (H) 30.0 - 36.0 g/dL   RDW 69.6 29.5 - 28.4 %   Platelets 212 150 - 400 K/uL   Neutrophils Relative % 60 %   Neutro Abs 5.3 1.7 - 7.7 K/uL   Lymphocytes Relative 33 %   Lymphs Abs 2.9 0.7 - 4.0 K/uL   Monocytes  Relative 6 %   Monocytes Absolute 0.5 0.1 - 1.0 K/uL   Eosinophils Relative 1 %   Eosinophils Absolute 0.1 0.0 - 0.7 K/uL   Basophils Relative 0 %   Basophils Absolute 0.0 0.0 - 0.1 K/uL    --/--/O POS (06/08 1907)  IMAGING No results found.  MAU Management/MDM:  Cultures were done to rule out pelvic infection at last visit Blood drawn for  CBC/diff to rule out appendicitis UA done to rule out UTI as source of pain Fetal heart tones heard by doppler which is reassuring for intact pregnancy Suspect pain may be related to constipation   This pain  can represent a normal pregnancy with bleeding, spontaneous abortion or even an ectopic which can be life-threatening.  The process as listed above helps to determine which of these is present.    ASSESSMENT SIUP at [id number] Abdominal pain in pregnancy Constipation  PLAN Discharge home Discussed fiber supplements and Miralax PRN Reassured baby is doing well Follow up with prenatal care for recheck of symptoms     Medication List    ASK your doctor about these medications        ibuprofen 600 MG tablet  Commonly known as:  ADVIL,MOTRIN  Take 1 tablet (600 mg total) by mouth every 6 (six) hours as needed for mild pain.     metroNIDAZOLE 500 MG tablet  Commonly known as:  FLAGYL  Take 1 tablet (500 mg total) by mouth 2 (two) times daily.     miconazole 2 % vaginal cream  Commonly known as:  MONISTAT 7  Place 1 Applicatorful vaginally at bedtime.     naproxen 500 MG tablet  Commonly known as:  NAPROSYN  Take 1 tablet (500 mg total) by mouth 2 (two) times daily.     oxyCODONE-acetaminophen 5-325 MG tablet  Commonly known as:  PERCOCET/ROXICET  Take 1-2 tablets by mouth every 4 (four) hours as needed for severe pain (moderate - severe pain).     PRENATAL DHA PO  Take 1 tablet by mouth daily.        Pt stable at time of discharge. Encouraged to return here or to other Urgent Care/ED if she develops worsening of  symptoms, increase in pain, fever, or other concerning symptoms.    Wynelle BourgeoisMarie Torre [place]umburg CNM, MSN Certified Nurse-Midwife 06/13/2016  12:33 AM

## 2016-06-13 NOTE — Discharge Instructions (Signed)
First Trimester of Pregnancy °The first trimester of pregnancy is from week 1 until the end of week 12 (months 1 through 3). A week after a sperm fertilizes an egg, the egg will implant on the wall of the uterus. This embryo will begin to develop into a baby. Genes from you and your partner are forming the baby. The female genes determine whether the baby is a boy or a girl. At 6-8 weeks, the eyes and face are formed, and the heartbeat can be seen on ultrasound. At the end of 12 weeks, all the baby's organs are formed.  °Now that you are pregnant, you will want to do everything you can to have a healthy baby. Two of the most important things are to get good prenatal care and to follow your health care provider's instructions. Prenatal care is all the medical care you receive before the baby's birth. This care will help prevent, find, and treat any problems during the pregnancy and childbirth. °BODY CHANGES °Your body goes through many changes during pregnancy. The changes vary from woman to woman.  °· You may gain or lose a couple of pounds at first. °· You may feel sick to your stomach (nauseous) and throw up (vomit). If the vomiting is uncontrollable, call your health care provider. °· You may tire easily. °· You may develop headaches that can be relieved by medicines approved by your health care provider. °· You may urinate more often. Painful urination may mean you have a bladder infection. °· You may develop heartburn as a result of your pregnancy. °· You may develop constipation because certain hormones are causing the muscles that push waste through your intestines to slow down. °· You may develop hemorrhoids or swollen, bulging veins (varicose veins). °· Your breasts may begin to grow larger and become tender. Your nipples may stick out more, and the tissue that surrounds them (areola) may become darker. °· Your gums may bleed and may be sensitive to brushing and flossing. °· Dark spots or blotches (chloasma,  mask of pregnancy) may develop on your face. This will likely fade after the baby is born. °· Your menstrual periods will stop. °· You may have a loss of appetite. °· You may develop cravings for certain kinds of food. °· You may have changes in your emotions from day to day, such as being excited to be pregnant or being concerned that something may go wrong with the pregnancy and baby. °· You may have more vivid and strange dreams. °· You may have changes in your hair. These can include thickening of your hair, rapid growth, and changes in texture. Some women also have hair loss during or after pregnancy, or hair that feels dry or thin. Your hair will most likely return to normal after your baby is born. °WHAT TO EXPECT AT YOUR PRENATAL VISITS °During a routine prenatal visit: °· You will be weighed to make sure you and the baby are growing normally. °· Your blood pressure will be taken. °· Your abdomen will be measured to track your baby's growth. °· The fetal heartbeat will be listened to starting around week 10 or 12 of your pregnancy. °· Test results from any previous visits will be discussed. °Your health care provider may ask you: °· How you are feeling. °· If you are feeling the baby move. °· If you have had any abnormal symptoms, such as leaking fluid, bleeding, severe headaches, or abdominal cramping. °· If you are using any tobacco products,   including cigarettes, chewing tobacco, and electronic cigarettes. °· If you have any questions. °Other tests that may be performed during your first trimester include: °· Blood tests to find your blood type and to check for the presence of any previous infections. They will also be used to check for low iron levels (anemia) and Rh antibodies. Later in the pregnancy, blood tests for diabetes will be done along with other tests if problems develop. °· Urine tests to check for infections, diabetes, or protein in the urine. °· An ultrasound to confirm the proper growth  and development of the baby. °· An amniocentesis to check for possible genetic problems. °· Fetal screens for spina bifida and Down syndrome. °· You may need other tests to make sure you and the baby are doing well. °· HIV (human immunodeficiency virus) testing. Routine prenatal testing includes screening for HIV, unless you choose not to have this test. °HOME CARE INSTRUCTIONS  °Medicines °· Follow your health care provider's instructions regarding medicine use. Specific medicines may be either safe or unsafe to take during pregnancy. °· Take your prenatal vitamins as directed. °· If you develop constipation, try taking a stool softener if your health care provider approves. °Diet °· Eat regular, well-balanced meals. Choose a variety of foods, such as meat or vegetable-based protein, fish, milk and low-fat dairy products, vegetables, fruits, and whole grain breads and cereals. Your health care provider will help you determine the amount of weight gain that is right for you. °· Avoid raw meat and uncooked cheese. These carry germs that can cause birth defects in the baby. °· Eating four or five small meals rather than three large meals a day may help relieve nausea and vomiting. If you start to feel nauseous, eating a few soda crackers can be helpful. Drinking liquids between meals instead of during meals also seems to help nausea and vomiting. °· If you develop constipation, eat more high-fiber foods, such as fresh vegetables or fruit and whole grains. Drink enough fluids to keep your urine clear or pale yellow. °Activity and Exercise °· Exercise only as directed by your health care provider. Exercising will help you: °¨ Control your weight. °¨ Stay in shape. °¨ Be prepared for labor and delivery. °· Experiencing pain or cramping in the lower abdomen or low back is a good sign that you should stop exercising. Check with your health care provider before continuing normal exercises. °· Try to avoid standing for long  periods of time. Move your legs often if you must stand in one place for a long time. °· Avoid heavy lifting. °· Wear low-heeled shoes, and practice good posture. °· You may continue to have sex unless your health care provider directs you otherwise. °Relief of Pain or Discomfort °· Wear a good support bra for breast tenderness.   °· Take warm sitz baths to soothe any pain or discomfort caused by hemorrhoids. Use hemorrhoid cream if your health care provider approves.   °· Rest with your legs elevated if you have leg cramps or low back pain. °· If you develop varicose veins in your legs, wear support hose. Elevate your feet for 15 minutes, 3-4 times a day. Limit salt in your diet. °Prenatal Care °· Schedule your prenatal visits by the twelfth week of pregnancy. They are usually scheduled monthly at first, then more often in the last 2 months before delivery. °· Write down your questions. Take them to your prenatal visits. °· Keep all your prenatal visits as directed by your   health care provider. °Safety °· Wear your seat belt at all times when driving. °· Make a list of emergency phone numbers, including numbers for family, friends, the hospital, and police and fire departments. °General Tips °· Ask your health care provider for a referral to a local prenatal education class. Begin classes no later than at the beginning of month 6 of your pregnancy. °· Ask for help if you have counseling or nutritional needs during pregnancy. Your health care provider can offer advice or refer you to specialists for help with various needs. °· Do not use hot tubs, steam rooms, or saunas. °· Do not douche or use tampons or scented sanitary pads. °· Do not cross your legs for long periods of time. °· Avoid cat litter boxes and soil used by cats. These carry germs that can cause birth defects in the baby and possibly loss of the fetus by miscarriage or stillbirth. °· Avoid all smoking, herbs, alcohol, and medicines not prescribed by  your health care provider. Chemicals in these affect the formation and growth of the baby. °· Do not use any tobacco products, including cigarettes, chewing tobacco, and electronic cigarettes. If you need help quitting, ask your health care provider. You may receive counseling support and other resources to help you quit. °· Schedule a dentist appointment. At home, brush your teeth with a soft toothbrush and be gentle when you floss. °SEEK MEDICAL CARE IF:  °· You have dizziness. °· You have mild pelvic cramps, pelvic pressure, or nagging pain in the abdominal area. °· You have persistent nausea, vomiting, or diarrhea. °· You have a bad smelling vaginal discharge. °· You have pain with urination. °· You notice increased swelling in your face, hands, legs, or ankles. °SEEK IMMEDIATE MEDICAL CARE IF:  °· You have a fever. °· You are leaking fluid from your vagina. °· You have spotting or bleeding from your vagina. °· You have severe abdominal cramping or pain. °· You have rapid weight gain or loss. °· You vomit blood or material that looks like coffee grounds. °· You are exposed to German measles and have never had them. °· You are exposed to fifth disease or chickenpox. °· You develop a severe headache. °· You have shortness of breath. °· You have any kind of trauma, such as from a fall or a car accident. °  °This information is not intended to replace advice given to you by your health care provider. Make sure you discuss any questions you have with your health care provider. °  °Document Released: 11/10/2001 Document Revised: 12/07/2014 Document Reviewed: 09/26/2013 °[employer]vier Interactive Patient Education ©2016 [employer] °Constipation, Adult °Constipation is when a person has fewer than three bowel movements a week, has difficulty having a bowel movement, or has stools that are dry, hard, or larger than normal. As people grow older, constipation is more common. A low-fiber diet, not taking in enough fluids, and  taking certain medicines may make constipation worse.  °CAUSES  °· Certain medicines, such as antidepressants, pain medicine, iron supplements, antacids, and water pills.   °· Certain diseases, such as diabetes, irritable bowel syndrome (IBS), thyroid disease, or depression.   °· Not drinking enough water.   °· Not eating enough fiber-rich foods.   °· Stress or travel.   °· Lack of physical activity or exercise.   °· Ignoring the urge to have a bowel movement.   °· Using laxatives too much.   °SIGNS AND SYMPTOMS  °· Having fewer than three bowel movements a week.   °· Straining   to have a bowel movement.   °· Having stools that are hard, dry, or larger than normal.   °· Feeling full or bloated.   °· Pain in the lower abdomen.   °· Not feeling relief after having a bowel movement.   °DIAGNOSIS  °Your health care provider will take a medical history and perform a physical exam. Further testing may be done for severe constipation. Some tests may include: °· A barium enema X-ray to examine your rectum, colon, and, sometimes, your small intestine.   °· A sigmoidoscopy to examine your lower colon.   °· A colonoscopy to examine your entire colon. °TREATMENT  °Treatment will depend on the severity of your constipation and what is causing it. Some dietary treatments include drinking more fluids and eating more fiber-rich foods. Lifestyle treatments may include regular exercise. If these diet and lifestyle recommendations do not help, your health care provider may recommend taking over-the-counter laxative medicines to help you have bowel movements. Prescription medicines may be prescribed if over-the-counter medicines do not work.  °HOME CARE INSTRUCTIONS  °· Eat foods that have a lot of fiber, such as fruits, vegetables, whole grains, and beans. °· Limit foods high in fat and processed sugars, such as french fries, hamburgers, cookies, candies, and soda.   °· A fiber supplement may be added to your diet if you cannot get  enough fiber from foods.   °· Drink enough fluids to keep your urine clear or pale yellow.   °· Exercise regularly or as directed by your health care provider.   °· Go to the restroom when you have the urge to go. Do not hold it.   °· Only take over-the-counter or prescription medicines as directed by your health care provider. Do not take other medicines for constipation without talking to your health care provider first.   °SEEK IMMEDIATE MEDICAL CARE IF:  °· You have bright red blood in your stool.   °· Your constipation lasts for more than 4 days or gets worse.   °· You have abdominal or rectal pain.   °· You have thin, pencil-like stools.   °· You have unexplained weight loss. °MAKE SURE YOU:  °· Understand these instructions. °· Will watch your condition. °· Will get help right away if you are not doing well or get worse. °  °This information is not intended to replace advice given to you by your health care provider. Make sure you discuss any questions you have with your health care provider. °  °Document Released: 08/14/2004 Document Revised: 12/07/2014 Document Reviewed: 08/28/2013 °Elsevier Interactive Patient Education ©2016 [employer] ° °

## 2016-06-22 ENCOUNTER — Ambulatory Visit (INDEPENDENT_AMBULATORY_CARE_PROVIDER_SITE_OTHER): Payer: Medicaid Other | Admitting: Student

## 2016-06-22 ENCOUNTER — Encounter: Payer: Self-pay | Admitting: Advanced Practice Midwife

## 2016-06-22 ENCOUNTER — Other Ambulatory Visit (HOSPITAL_COMMUNITY)
Admission: RE | Admit: 2016-06-22 | Discharge: 2016-06-22 | Disposition: A | Discharge status: Home / Self Care | Payer: Medicaid Other | Source: Ambulatory Visit | Attending: Student | Admitting: Student

## 2016-06-22 DIAGNOSIS — Z3481 Encounter for supervision of other normal pregnancy, first trimester: Secondary | ICD-10-CM

## 2016-06-22 DIAGNOSIS — Z1151 Encounter for screening for human papillomavirus (HPV): Secondary | ICD-10-CM | POA: Insufficient documentation

## 2016-06-22 DIAGNOSIS — O34219 Maternal care for unspecified type scar from previous cesarean delivery: Secondary | ICD-10-CM

## 2016-06-22 DIAGNOSIS — Z98891 History of uterine scar from previous surgery: Secondary | ICD-10-CM

## 2016-06-22 DIAGNOSIS — Z01419 Encounter for gynecological examination (general) (routine) without abnormal findings: Secondary | ICD-10-CM | POA: Diagnosis present

## 2016-06-22 DIAGNOSIS — Z3480 Encounter for supervision of other normal pregnancy, unspecified trimester: Secondary | ICD-10-CM | POA: Insufficient documentation

## 2016-06-22 DIAGNOSIS — Z3491 Encounter for supervision of normal pregnancy, unspecified, first trimester: Secondary | ICD-10-CM

## 2016-06-22 NOTE — Addendum Note (Signed)
Addended by: Gerome Apley on: 06/22/2016 03:35 PM   Modules accepted: Orders

## 2016-06-22 NOTE — Patient Instructions (Signed)

## 2016-06-22 NOTE — Progress Notes (Signed)
Subjective:    Sabrina Bray is being seen today for her first obstetrical visit.  This is not a planned pregnancy. She is at [redacted]w[redacted]d gestation. Her obstetrical history is significant for smoker and previous c/section x 2. Relationship with FOB: significant other, living together. Patient does intend to breast feed. Pregnancy history fully reviewed.  Menstrual History: OB History    Gravida Para Term Preterm AB Living   4 2 2   1 2    SAB TAB Ectopic Multiple Live Births   1       2       Patient's last menstrual period was 03/30/2016.    The following portions of the patient's history were reviewed and updated as appropriate: allergies, current medications, past family history, past medical history, past social history, past surgical history and problem list.  Review of Systems A comprehensive review of systems was negative.    Objective:    BP 126/79   Pulse 80   Wt 181 lb 9 oz (82.4 kg)   LMP 03/30/2016   BMI 32.16 kg/m    FHT 145 by doppler  Physical Examination: General appearance - alert, well appearing, and in no distress, oriented to person, place, and time and overweight Mental status - alert, oriented to person, place, and time Mouth - dental hygiene good Neck - supple, no significant adenopathy, thyroid exam: thyroid is normal in size without nodules or tenderness Lymphatics - no palpable lymphadenopathy Chest - clear to auscultation, no wheezes, rales or rhonchi, symmetric air entry Heart - normal rate, regular rhythm, normal S1, S2, no murmurs, rubs, clicks or gallops Abdomen - soft, nontender, nondistended, no masses or organomegaly Pelvic - VULVA: normal appearing vulva with no masses, tenderness or lesions, VAGINA: normal appearing vagina with normal color and discharge, no lesions, CERVIX: normal appearing cervix without discharge or lesions, UTERUS: uterus is normal shape, consistency and nontender, enlarged to 12 week's size, ADNEXA: normal adnexa in size,  nontender and no masses, PAP: Pap smear done today, exam chaperoned by nurse tech Musculoskeletal - no joint tenderness, deformity or swelling Skin - normal coloration and turgor, no rashes, no suspicious skin lesions noted     Assessment:    Pregnancy at 12 and 0/7 weeks    Plan:  1. Supervision of normal pregnancy in first trimester  - Antibody screen; Future - Rubella screen - Hepatitis B Surface AntiGEN - Culture, OB Urine - Prescription Abuse Monitoring, 17 Panel - [place] MFM OB COMP + 14 WK; Future - Cytology - PAP  2. Previous cesarean section -x2, plans on repeat c/section   Initial labs drawn. Prenatal vitamins. Problem list reviewed and updated. AFP3 discussed: requested. Role of ultrasound in pregnancy discussed; fetal survey: ordered. Amniocentesis discussed: not indicated. Follow up in 4 weeks.

## 2016-06-22 NOTE — Progress Notes (Signed)
Here for initial prenatal visit. Given new education pamphlets.

## 2016-06-23 LAB — RUBELLA SCREEN: Rubella: 2.61 Index — ABNORMAL HIGH (ref <0.90)

## 2016-06-23 LAB — CULTURE, OB URINE: Colony Count: NO GROWTH

## 2016-06-23 LAB — POCT URINALYSIS DIP (DEVICE)
BILIRUBIN URINE: NEGATIVE
Glucose, UA: NEGATIVE mg/dL
HGB URINE DIPSTICK: NEGATIVE
KETONES UR: NEGATIVE mg/dL
Leukocytes, UA: NEGATIVE
Nitrite: NEGATIVE
PH: 6 (ref 5.0–8.0)
Protein, ur: NEGATIVE mg/dL
Urobilinogen, UA: 0.2 mg/dL (ref 0.0–1.0)

## 2016-06-23 LAB — HEPATITIS B SURFACE ANTIGEN: HEP B S AG: NEGATIVE

## 2016-06-24 LAB — CYTOLOGY - PAP

## 2016-06-25 LAB — PAIN MGMT, PROFILE 6 CONF W/O MM, U
6 Acetylmorphine: NEGATIVE ng/mL (ref <10)
Alcohol Metabolites: NEGATIVE ng/mL (ref <500)
Amphetamines: NEGATIVE ng/mL (ref <500)
Barbiturates: NEGATIVE ng/mL (ref <300)
Benzodiazepines: NEGATIVE ng/mL (ref <100)
Cocaine Metabolite: NEGATIVE ng/mL (ref <150)
Codeine: NEGATIVE ng/mL (ref <50)
Creatinine: 170 mg/dL (ref >=20.0)
Hydrocodone: 103 ng/mL — ABNORMAL HIGH (ref <50)
Hydromorphone: 55 ng/mL — ABNORMAL HIGH (ref <50)
Marijuana Metabolite: NEGATIVE ng/mL (ref <20)
Methadone Metabolite: NEGATIVE ng/mL (ref <100)
Morphine: NEGATIVE ng/mL (ref <50)
Norhydrocodone: 341 ng/mL — ABNORMAL HIGH (ref <50)
Opiates: POSITIVE ng/mL — AB (ref <100)
Oxidant: NEGATIVE ug/mL (ref <200)
Oxycodone: NEGATIVE ng/mL (ref <100)
Phencyclidine: NEGATIVE ng/mL (ref <25)
Please note:: 0
pH: 6.24 (ref 4.5–9.0)

## 2016-06-27 ENCOUNTER — Encounter: Payer: Self-pay | Admitting: Advanced Practice Midwife

## 2016-06-27 DIAGNOSIS — R825 Elevated urine levels of drugs, medicaments and biological substances: Secondary | ICD-10-CM | POA: Insufficient documentation

## 2016-07-20 ENCOUNTER — Ambulatory Visit (INDEPENDENT_AMBULATORY_CARE_PROVIDER_SITE_OTHER): Payer: Medicaid Other | Admitting: Medical

## 2016-07-20 DIAGNOSIS — Z3481 Encounter for supervision of other normal pregnancy, first trimester: Secondary | ICD-10-CM | POA: Diagnosis not present

## 2016-07-20 DIAGNOSIS — Z3491 Encounter for supervision of normal pregnancy, unspecified, first trimester: Secondary | ICD-10-CM

## 2016-07-20 LAB — POCT URINALYSIS DIP (DEVICE)
Bilirubin Urine: NEGATIVE
Glucose, UA: NEGATIVE mg/dL
Hgb urine dipstick: NEGATIVE
KETONES UR: NEGATIVE mg/dL
Leukocytes, UA: NEGATIVE
Nitrite: NEGATIVE
PH: 6.5 (ref 5.0–8.0)
PROTEIN: NEGATIVE mg/dL
SPECIFIC GRAVITY, URINE: 1.025 (ref 1.005–1.030)
Urobilinogen, UA: 1 mg/dL (ref 0.0–1.0)
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
## 2016-07-20 NOTE — Progress Notes (Signed)
Subjective:  Sabrina Bray is a 30 y.o. [id number] at 6318w0d being seen today for ongoing prenatal care.  She is currently monitored for the following issues for this low-risk pregnancy and has Supervision of normal pregnancy in first trimester; Previous cesarean section; and Positive urine drug screen on her problem list.  Patient reports nausea.  Contractions: Not present. Vag. Bleeding: None.  Movement: Present. Denies leaking of fluid.   The following portions of the patient's history were reviewed and updated as appropriate: allergies, current medications, past family history, past medical history, past social history, past surgical history and problem list. Problem list updated.  Objective:   Vitals:   07/20/16 1411  BP: 124/75  Pulse: 91  Weight: 184 lb (83.5 kg)    Fetal Status: Fetal Heart Rate (bpm): 140   Movement: Present     General:  Alert, oriented and cooperative. Patient is in no acute distress.  Skin: Skin is warm and dry. No rash noted.   Cardiovascular: Normal heart rate noted  Respiratory: Normal respiratory effort, no problems with respiration noted  Abdomen: Soft, gravid, appropriate for gestational age. Pain/Pressure: Absent     Pelvic:  Cervical exam deferred        Extremities: Normal range of motion.  Edema: Trace  Mental Status: Normal mood and affect. Normal behavior. Normal judgment and thought content.   Urinalysis: Urine Protein: Negative Urine Glucose: Negative  Assessment and Plan:  Pregnancy: [id number] at 6218w0d  1. Supervision of normal pregnancy in first trimester - AFP, Quad Screen - patient states unable to get Rx for Diclegis, but declines other Rx today. States nausea without vomiting only, which is improving from earlier in the pregnancy  second trimester warning signs/ symptoms and general obstetric precautions including but not limited to vaginal bleeding, contractions, leaking of fluid and fetal movement were reviewed in detail with the  patient. Please refer to After Visit Summary for other counseling recommendations.  Return in about 4 weeks (around 08/17/2016) for LOB.   Marny LowensteinJulie N Wenzel, PA-C

## 2016-07-20 NOTE — Patient Instructions (Addendum)
Second Trimester of Pregnancy The second trimester is from week 13 through week 28, month 4 through 6. This is often the time in pregnancy that you feel your best. Often times, morning sickness has lessened or quit. You may have more energy, and you may get hungry more often. Your unborn baby (fetus) is growing rapidly. At the end of the sixth month, he or she is about 9 inches long and weighs about 1 pounds. You will likely feel the baby move (quickening) between 18 and 20 weeks of pregnancy. HOME CARE   Avoid all smoking, herbs, and alcohol. Avoid drugs not approved by your doctor.  Do not use any tobacco products, including cigarettes, chewing tobacco, and electronic cigarettes. If you need help quitting, ask your doctor. You may get counseling or other support to help you quit.  Only take medicine as told by your doctor. Some medicines are safe and some are not during pregnancy.  Exercise only as told by your doctor. Stop exercising if you start having cramps.  Eat regular, healthy meals.  Wear a good support bra if your breasts are tender.  Do not use hot tubs, steam rooms, or saunas.  Wear your seat belt when driving.  Avoid raw meat, uncooked cheese, and liter boxes and soil used by cats.  Take your prenatal vitamins.  Take 1500-2000 milligrams of calcium daily starting at the 20th week of pregnancy until you deliver your baby.  Try taking medicine that helps you poop (stool softener) as needed, and if your doctor approves. Eat more fiber by eating fresh fruit, vegetables, and whole grains. Drink enough fluids to keep your pee (urine) clear or pale yellow.  Take warm water baths (sitz baths) to soothe pain or discomfort caused by hemorrhoids. Use hemorrhoid cream if your doctor approves.  If you have puffy, bulging veins (varicose veins), wear support hose. Raise (elevate) your feet for 15 minutes, 3-4 times a day. Limit salt in your diet.  Avoid heavy lifting, wear low heals,  and sit up straight.  Rest with your legs raised if you have leg cramps or low back pain.  Visit your dentist if you have not gone during your pregnancy. Use a soft toothbrush to brush your teeth. Be gentle when you floss.  You can have sex (intercourse) unless your doctor tells you not to.  Go to your doctor visits. GET HELP IF:   You feel dizzy.  You have mild cramps or pressure in your lower belly (abdomen).  You have a nagging pain in your belly area.  You continue to feel sick to your stomach (nauseous), throw up (vomit), or have watery poop (diarrhea).  You have bad smelling fluid coming from your vagina.  You have pain with peeing (urination). GET HELP RIGHT AWAY IF:   You have a fever.  You are leaking fluid from your vagina.  You have spotting or bleeding from your vagina.  You have severe belly cramping or pain.  You lose or gain weight rapidly.  You have trouble catching your breath and have chest pain.  You notice sudden or extreme puffiness (swelling) of your face, hands, ankles, feet, or legs.  You have not felt the baby move in over an hour.  You have severe headaches that do not go away with medicine.  You have vision changes.   This information is not intended to replace advice given to you by your health care provider. Make sure you discuss any questions you have with your  health care provider.   Document Released: 02/10/2010 Document Revised: 12/07/2014 Document Reviewed: 01/17/2013 Elsevier Interactive Patient Education 2016 [employer]  Eating Plan for Nausea in pregnancy Severe cases of hyperemesis gravidarum can lead to dehydration and malnutrition. The hyperemesis eating plan is one way to lessen the symptoms of nausea and vomiting. It is often used with prescribed medicines to control your symptoms.  WHAT CAN I DO TO RELIEVE MY SYMPTOMS? Listen to your body. Everyone is different and has different preferences. Find what works best for  you. Some of the following things may help:  Eat and drink slowly.  Eat 5-6 small meals daily instead of 3 large meals.   Eat crackers before you get out of bed in the morning.   Starchy foods are usually well tolerated (such as cereal, toast, bread, potatoes, pasta, rice, and pretzels).   Ginger may help with nausea. Add  tsp ground ginger to hot tea or choose ginger tea.   Try drinking 100% fruit juice or an electrolyte drink.  Continue to take your prenatal vitamins as directed by your health care provider. If you are having trouble taking your prenatal vitamins, talk with your health care provider about different options.  Include at least 1 serving of protein with your meals and snacks (such as meats or poultry, beans, nuts, eggs, or yogurt). Try eating a protein-rich snack before bed (such as cheese and crackers or a half [place] or peanut butter sandwich). WHAT THINGS SHOULD I AVOID TO REDUCE MY SYMPTOMS? The following things may help reduce your symptoms:  Avoid foods with strong smells. Try eating meals in well-ventilated areas that are free of odors.  Avoid drinking water or other beverages with meals. Try not to drink anything less than 30 minutes before and after meals.  Avoid drinking more than 1 cup of fluid at a time.  Avoid fried or high-fat foods, such as butter and cream sauces.  Avoid spicy foods.  Avoid skipping meals the best you can. Nausea can be more intense on an empty stomach. If you cannot tolerate food at that time, do not force it. Try sucking on ice chips or other frozen items and make up the calories later.  Avoid lying down within 2 hours after eating.   This information is not intended to replace advice given to you by your health care provider. Make sure you discuss any questions you have with your health care provider.   Document Released: 09/13/2007 Document Revised: 11/21/2013 Document Reviewed: 09/20/2013 Elsevier Interactive Patient  Education [employer] Elsevier Inc.

## 2016-07-24 ENCOUNTER — Other Ambulatory Visit: Payer: Self-pay | Admitting: Medical

## 2016-07-24 DIAGNOSIS — O280 Abnormal hematological finding on antenatal screening of mother: Secondary | ICD-10-CM

## 2016-07-24 LAB — AFP, QUAD SCREEN
AFP: 53.5 ng/mL
Age Alone: 1:647 {titer}
Curr Gest Age: 16 weeks
HCG, Total: 13.84 IU/mL
INH: 180.2 pg/mL
Interpretation-AFP: NEGATIVE
MoM for AFP: 1.58
MoM for INH: 1.12
MoM for hCG: 0.36
Open Spina bifida: NEGATIVE
Osb Risk: 1:4400 {titer}
Tri 18 Scr Risk Est: POSITIVE — AB
Trisomy 18 (Edward) Syndrome Interp.: 1:31 {titer}
uE3 Mom: 0.45
uE3 Value: 0.36 ng/mL

## 2016-07-27 ENCOUNTER — Encounter (HOSPITAL_COMMUNITY): Payer: Self-pay | Admitting: Student

## 2016-08-07 ENCOUNTER — Other Ambulatory Visit: Payer: Self-pay | Admitting: Student

## 2016-08-07 ENCOUNTER — Encounter (HOSPITAL_COMMUNITY): Payer: Self-pay

## 2016-08-07 ENCOUNTER — Ambulatory Visit (HOSPITAL_COMMUNITY): Payer: Medicaid Other

## 2016-08-07 ENCOUNTER — Ambulatory Visit (HOSPITAL_COMMUNITY): Admission: RE | Admit: 2016-08-07 | Payer: Medicaid Other | Source: Ambulatory Visit

## 2016-08-07 ENCOUNTER — Ambulatory Visit (HOSPITAL_COMMUNITY)
Admission: RE | Admit: 2016-08-07 | Discharge: 2016-08-07 | Disposition: A | Discharge status: Home / Self Care | Payer: Medicaid Other | Source: Ambulatory Visit | Attending: Student | Admitting: Student

## 2016-08-07 DIAGNOSIS — O34219 Maternal care for unspecified type scar from previous cesarean delivery: Secondary | ICD-10-CM

## 2016-08-07 DIAGNOSIS — Z98891 History of uterine scar from previous surgery: Secondary | ICD-10-CM

## 2016-08-07 DIAGNOSIS — O289 Unspecified abnormal findings on antenatal screening of mother: Secondary | ICD-10-CM

## 2016-08-07 DIAGNOSIS — Z1389 Encounter for screening for other disorder: Secondary | ICD-10-CM

## 2016-08-07 DIAGNOSIS — Z3A18 18 weeks gestation of pregnancy: Secondary | ICD-10-CM | POA: Diagnosis not present

## 2016-08-07 DIAGNOSIS — Z3491 Encounter for supervision of normal pregnancy, unspecified, first trimester: Secondary | ICD-10-CM

## 2016-08-07 DIAGNOSIS — O283 Abnormal ultrasonic finding on antenatal screening of mother: Secondary | ICD-10-CM | POA: Insufficient documentation

## 2016-08-19 ENCOUNTER — Telehealth: Payer: Self-pay | Admitting: Obstetrics and Gynecology

## 2016-08-19 ENCOUNTER — Encounter: Payer: Medicaid Other | Admitting: Certified Nurse Midwife

## 2016-08-19 NOTE — Telephone Encounter (Signed)
Patient called to say she wasn't feeling well today. I asked was it pregnancy related, she stated no. I informed her that we would have to give her the next appointment available. Patient stated she understood, and that would be fine.

## 2016-09-07 ENCOUNTER — Ambulatory Visit (INDEPENDENT_AMBULATORY_CARE_PROVIDER_SITE_OTHER): Payer: Medicaid Other | Admitting: Obstetrics and Gynecology

## 2016-09-07 VITALS — BP 114/75 | HR 87 | Wt 182.3 lb

## 2016-09-07 DIAGNOSIS — O99612 Diseases of the digestive system complicating pregnancy, second trimester: Secondary | ICD-10-CM

## 2016-09-07 DIAGNOSIS — Z3481 Encounter for supervision of other normal pregnancy, first trimester: Secondary | ICD-10-CM

## 2016-09-07 DIAGNOSIS — K219 Gastro-esophageal reflux disease without esophagitis: Secondary | ICD-10-CM

## 2016-09-07 MED ORDER — RANITIDINE HCL 150 MG PO TABS: 150.0000 mg | ORAL_TABLET | Freq: Two times a day (BID) | ORAL | 3 refills | Status: DC

## 2016-09-07 NOTE — Patient Instructions (Signed)

## 2016-09-07 NOTE — Progress Notes (Signed)
   PRENATAL VISIT NOTE  Subjective:  Sabrina Bray is a 30 y.o. [id number] at 5745w0d being seen today for ongoing prenatal care.  She is currently monitored for the following issues for this low-risk pregnancy and has Supervision of normal pregnancy in first trimester; Previous cesarean section; and Positive urine drug screen on her problem list.  Patient reports heartburn and and epigastric pain.  Contractions: Not present. Vag. Bleeding: None.  Movement: Present. Denies leaking of fluid.   The following portions of the patient's history were reviewed and updated as appropriate: allergies, current medications, past family history, past medical history, past social history, past surgical history and problem list. Problem list updated.  Objective:   Vitals:   09/07/16 1319  BP: 114/75  Pulse: 87  Weight: 182 lb 4.8 oz (82.7 kg)    Fetal Status: Fetal Heart Rate (bpm): 145   Movement: Present     General:  Alert, oriented and cooperative. Patient is in no acute distress.  Skin: Skin is warm and dry. No rash noted.   Cardiovascular: Normal heart rate noted  Respiratory: Normal respiratory effort, no problems with respiration noted  Abdomen: Soft, gravid, appropriate for gestational age. Pain/Pressure: Present     Pelvic:  Cervical exam deferred        Extremities: Normal range of motion.  Edema: None  Mental Status: Normal mood and affect. Normal behavior. Normal judgment and thought content.   Urinalysis:      Assessment and Plan:  Pregnancy: [id number] at 3845w0d  1. Encounter for supervision of other normal pregnancy in first trimester -routine care -declined flu shots, counseled on risk to newborn -anatomy US reviewed and WNL -follow up 4 wks for glucola  2. GERD -zantac sent to pts pharmacy -discussed lifestyle changes.  Preterm labor symptoms and general obstetric precautions including but not limited to vaginal bleeding, contractions, leaking of fluid and fetal movement were  reviewed in detail with the patient. Please refer to After Visit Summary for other counseling recommendations.  Return in about 4 weeks (around 10/05/2016) for LOB.  Lorne SkeensNicholas Michael Loribeth Katich, MD

## 2016-09-07 NOTE — Progress Notes (Signed)
Flu vaccine declined

## 2016-10-05 ENCOUNTER — Ambulatory Visit (INDEPENDENT_AMBULATORY_CARE_PROVIDER_SITE_OTHER): Payer: Medicaid Other | Admitting: Family Medicine

## 2016-10-05 VITALS — BP 117/74 | HR 87 | Wt 184.5 lb

## 2016-10-05 DIAGNOSIS — Z3482 Encounter for supervision of other normal pregnancy, second trimester: Secondary | ICD-10-CM

## 2016-10-05 DIAGNOSIS — O34219 Maternal care for unspecified type scar from previous cesarean delivery: Secondary | ICD-10-CM

## 2016-10-05 DIAGNOSIS — R825 Elevated urine levels of drugs, medicaments and biological substances: Secondary | ICD-10-CM

## 2016-10-05 DIAGNOSIS — Z3480 Encounter for supervision of other normal pregnancy, unspecified trimester: Secondary | ICD-10-CM

## 2016-10-05 DIAGNOSIS — Z98891 History of uterine scar from previous surgery: Secondary | ICD-10-CM

## 2016-10-05 NOTE — Patient Instructions (Signed)
Glucose Tolerance Test During Pregnancy The glucose tolerance test (GTT) is a blood test used to determine if you have developed a type of diabetes during pregnancy (gestational diabetes). This is when your body does not properly process sugar (glucose) in the food you eat, resulting in high blood glucose levels. Typically, a GTT is done after you have had a 1-hour glucose test with results that indicate you possibly have gestational diabetes. It may also be done if:  You have a history of giving birth to very large babies or have experienced repeated fetal loss (stillbirth).   You have signs and symptoms of diabetes, such as:   Changes in your vision.   Tingling or numbness in your hands or feet.   Changes in hunger, thirst, and urination not otherwise explained by your pregnancy.  The GTT lasts about 3 hours. You will be given a sugar-water solution to drink at the beginning of the test. You will have blood drawn before you drink the solution and then again 1, 2, and 3 hours after you drink it. You will not be allowed to eat or drink anything else during the test. You must remain at the testing location to make sure that your blood is drawn on time. You should also avoid exercising during the test, because exercise can alter test results. PREPARATION FOR TEST  Eat normally for 3 days prior to the GTT test, including having plenty of carbohydrate-rich foods. Do not eat or drink anything except water during the final 12 hours before the test. In addition, your health care provider may ask you to stop taking certain medicines before the test. RESULTS  It is your responsibility to obtain your test results. Ask the lab or department performing the test when and how you will get your results. Contact your health care provider to discuss any questions you have about your results.  Range of Normal Values Ranges for normal values may vary among different labs and hospitals. You should always check  with your health care provider after having lab work or other tests done to discuss whether your values are considered within normal limits. Normal levels of blood glucose are as follows:  Fasting: less than 105 mg/dL.   1 hour after drinking the solution: less than 190 mg/dL.   2 hours after drinking the solution: less than 165 mg/dL.   3 hours after drinking the solution: less than 145 mg/dL.  Some substances can interfere with GTT results. These may include:  Blood pressure and heart failure medicines, including beta blockers, furosemide, and thiazides.   Anti-inflammatory medicines, including aspirin.   Nicotine.   Some psychiatric medicines.  Meaning of Results Outside Normal Value Ranges GTT test results that are above normal values may indicate a number of health problems, such as:   Gestational diabetes.   Acute stress response.   Cushing syndrome.   Tumors such as pheochromocytoma or glucagonoma.   Long-term kidney problems.   Pancreatitis.   Hyperthyroidism.   Current infection.  Discuss your test results with your health care provider. He or she will use the results to make a diagnosis and determine a treatment plan that is right for you.   This information is not intended to replace advice given to you by your health care provider. Make sure you discuss any questions you have with your health care provider.   Document Released: 05/17/2012 Document Revised: 12/07/2014 Document Reviewed: 03/23/2014 Elsevier Interactive Patient Education [employer]   Third Trimester of  Pregnancy The third trimester is from week 29 through week 42, months 7 through 9. This trimester is when your unborn baby (fetus) is growing very fast. At the end of the ninth month, the unborn baby is about 20 inches in length. It weighs about 6-10 pounds.  HOME CARE   Avoid all smoking, herbs, and alcohol. Avoid drugs not approved by your doctor.  Do not use any  tobacco products, including cigarettes, chewing tobacco, and electronic cigarettes. If you need help quitting, ask your doctor. You may get counseling or other support to help you quit.  Only take medicine as told by your doctor. Some medicines are safe and some are not during pregnancy.  Exercise only as told by your doctor. Stop exercising if you start having cramps.  Eat regular, healthy meals.  Wear a good support bra if your breasts are tender.  Do not use hot tubs, steam rooms, or saunas.  Wear your seat belt when driving.  Avoid raw meat, uncooked cheese, and liter boxes and soil used by cats.  Take your prenatal vitamins.  Take 1500-2000 milligrams of calcium daily starting at the 20th week of pregnancy until you deliver your baby.  Try taking medicine that helps you poop (stool softener) as needed, and if your doctor approves. Eat more fiber by eating fresh fruit, vegetables, and whole grains. Drink enough fluids to keep your pee (urine) clear or pale yellow.  Take warm water baths (sitz baths) to soothe pain or discomfort caused by hemorrhoids. Use hemorrhoid cream if your doctor approves.  If you have puffy, bulging veins (varicose veins), wear support hose. Raise (elevate) your feet for 15 minutes, 3-4 times a day. Limit salt in your diet.  Avoid heavy lifting, wear low heels, and sit up straight.  Rest with your legs raised if you have leg cramps or low back pain.  Visit your dentist if you have not gone during your pregnancy. Use a soft toothbrush to brush your teeth. Be gentle when you floss.  You can have sex (intercourse) unless your doctor tells you not to.  Do not travel far distances unless you must. Only do so with your doctor's approval.  Take prenatal classes.  Practice driving to the hospital.  Pack your hospital bag.  Prepare the baby's room.  Go to your doctor visits. GET HELP IF:  You are not sure if you are in labor or if your water has  broken.  You are dizzy.  You have mild cramps or pressure in your lower belly (abdominal).  You have a nagging pain in your belly area.  You continue to feel sick to your stomach (nauseous), throw up (vomit), or have watery poop (diarrhea).  You have bad smelling fluid coming from your vagina.  You have pain with peeing (urination). GET HELP RIGHT AWAY IF:   You have a fever.  You are leaking fluid from your vagina.  You are spotting or bleeding from your vagina.  You have severe belly cramping or pain.  You lose or gain weight rapidly.  You have trouble catching your breath and have chest pain.  You notice sudden or extreme puffiness (swelling) of your face, hands, ankles, feet, or legs.  You have not felt the baby move in over an hour.  You have severe headaches that do not go away with medicine.  You have vision changes.   This information is not intended to replace advice given to you by your health care provider. Make  sure you discuss any questions you have with your health care provider.   Document Released: 02/10/2010 Document Revised: 12/07/2014 Document Reviewed: 01/17/2013 Elsevier Interactive Patient Education [employer] Elsevier Inc.

## 2016-10-05 NOTE — Progress Notes (Signed)
Subjective:  Sabrina Bray is a 30 y.o. [id number] at 7519w0d being seen today for ongoing prenatal care.  She is currently monitored for the following issues for this low-risk pregnancy and has Supervision of normal pregnancy; Previous cesarean section; and Positive urine drug screen on her problem list.  Patient reports no complaints.  Contractions: Not present. Vag. Bleeding: None.  Movement: Present. Denies leaking of fluid.   The following portions of the patient's history were reviewed and updated as appropriate: allergies, current medications, past family history, past medical history, past social history, past surgical history and problem list. Problem list updated.  Objective:   Vitals:   10/05/16 1349  BP: 117/74  Pulse: 87  Weight: 184 lb 8 oz (83.7 kg)    Fetal Status: Fetal Heart Rate (bpm): 137   Movement: Present     General:  Alert, oriented and cooperative. Patient is in no acute distress.  Skin: Skin is warm and dry. No rash noted.   Cardiovascular: Normal heart rate noted  Respiratory: Normal respiratory effort, no problems with respiration noted  Abdomen: Soft, gravid, appropriate for gestational age. Pain/Pressure: Present     Pelvic:  Cervical exam deferred        Extremities: Normal range of motion.  Edema: None  Mental Status: Normal mood and affect. Normal behavior. Normal judgment and thought content.   Urinalysis:      Assessment and Plan:  Pregnancy: [id number] at 4819w0d  1. Encounter for supervision of normal pregnancy in multigravida - Routine care - Declined vaccines today  2. Previous cesarean section - Plan for repeat  3. Positive urine drug screen   Preterm labor symptoms and general obstetric precautions including but not limited to vaginal bleeding, contractions, leaking of fluid and fetal movement were reviewed in detail with the patient. Please refer to After Visit Summary for other counseling recommendations.  Return in about 4 weeks (around  11/02/2016) for Routine OB visit; 1 week for labs/2 hr GTT.   [facility]manElizabeth Woodland SimsMumaw, [place]

## 2016-10-12 ENCOUNTER — Other Ambulatory Visit: Payer: Medicaid Other

## 2016-11-02 ENCOUNTER — Encounter: Payer: Medicaid Other | Admitting: Obstetrics and Gynecology

## 2016-11-09 ENCOUNTER — Ambulatory Visit (INDEPENDENT_AMBULATORY_CARE_PROVIDER_SITE_OTHER): Payer: Medicaid Other | Admitting: Obstetrics & Gynecology

## 2016-11-09 VITALS — BP 126/79 | HR 105 | Wt 186.0 lb

## 2016-11-09 DIAGNOSIS — Z98891 History of uterine scar from previous surgery: Secondary | ICD-10-CM

## 2016-11-09 DIAGNOSIS — Z23 Encounter for immunization: Secondary | ICD-10-CM

## 2016-11-09 DIAGNOSIS — Z3483 Encounter for supervision of other normal pregnancy, third trimester: Secondary | ICD-10-CM

## 2016-11-09 DIAGNOSIS — O34219 Maternal care for unspecified type scar from previous cesarean delivery: Secondary | ICD-10-CM

## 2016-11-09 DIAGNOSIS — R825 Elevated urine levels of drugs, medicaments and biological substances: Secondary | ICD-10-CM

## 2016-11-09 DIAGNOSIS — Z3480 Encounter for supervision of other normal pregnancy, unspecified trimester: Secondary | ICD-10-CM

## 2016-11-09 LAB — CBC
HEMATOCRIT: 31.9 % — AB (ref 35.0–45.0)
Hemoglobin: 10.7 g/dL — ABNORMAL LOW (ref 11.7–15.5)
MCH: 29.4 pg (ref 27.0–33.0)
MCHC: 33.5 g/dL (ref 32.0–36.0)
MCV: 87.6 fL (ref 80.0–100.0)
MPV: 10.1 fL (ref 7.5–12.5)
PLATELETS: 163 10*3/uL (ref 140–400)
RBC: 3.64 MIL/uL — ABNORMAL LOW (ref 3.80–5.10)
RDW: 13.4 % (ref 11.0–15.0)
WBC: 8.7 10*3/uL (ref 3.8–10.8)

## 2016-11-09 LAB — POCT URINALYSIS DIP (DEVICE)
Glucose, UA: NEGATIVE mg/dL
HGB URINE DIPSTICK: NEGATIVE
KETONES UR: 80 mg/dL — AB
Leukocytes, UA: NEGATIVE
Nitrite: NEGATIVE
PH: 5.5 (ref 5.0–8.0)
PROTEIN: 30 mg/dL — AB
Specific Gravity, Urine: >=1.03 (ref 1.005–1.030)
Urobilinogen, UA: 1 mg/dL (ref 0.0–1.0)

## 2016-11-09 NOTE — Progress Notes (Signed)
   PRENATAL VISIT NOTE  Subjective:  Sabrina Bray is a 30 y.o. [id number] at 6199w0d being seen today for ongoing prenatal care.  She is currently monitored for the following issues for this low-risk pregnancy and has Encounter for supervision of normal pregnancy in multigravida; Previous cesarean section; and Positive urine drug screen on her problem list.  Patient reports no complaints.  Contractions: Irritability. Vag. Bleeding: None.  Movement: Present. Denies leaking of fluid.   The following portions of the patient's history were reviewed and updated as appropriate: allergies, current medications, past family history, past medical history, past social history, past surgical history and problem list. Problem list updated.  Objective:   Vitals:   11/09/16 1341  BP: 126/79  Pulse: (!) 105  Weight: 186 lb (84.4 kg)    Fetal Status: Fetal Heart Rate (bpm): 140 Fundal Height: 32 cm Movement: Present     General:  Alert, oriented and cooperative. Patient is in no acute distress.  Skin: Skin is warm and dry. No rash noted.   Cardiovascular: Normal heart rate noted  Respiratory: Normal respiratory effort, no problems with respiration noted  Abdomen: Soft, gravid, appropriate for gestational age. Pain/Pressure: Absent     Pelvic:  Cervical exam deferred        Extremities: Normal range of motion.  Edema: Trace  Mental Status: Normal mood and affect. Normal behavior. Normal judgment and thought content.   Assessment and Plan:  Pregnancy: [id number] at 10899w0d  1. Previous cesarean section Surgical scheduling order sent in today.  2. Positive urine drug screen - Pain Mgmt, Profile 6 Conf w/o mM, U  3. Need for Tdap vaccination - Tdap vaccine greater than or equal to 7yo IM  4. Encounter for supervision of normal pregnancy in multigravida - Antibody screen - CBC - RPR - Glucose Tolerance, 1 HR (50g) - Pain Mgmt, Profile 6 Conf w/o mM, U - HIV antibody  Preterm labor symptoms and  general obstetric precautions including but not limited to vaginal bleeding, contractions, leaking of fluid and fetal movement were reviewed in detail with the patient. Please refer to After Visit Summary for other counseling recommendations.  Return in about 2 weeks (around 11/23/2016) for OB Visit.   Tereso NewcomerUgonna A Anyanwu, MD

## 2016-11-09 NOTE — Patient Instructions (Signed)
Return to clinic for any scheduled appointments or obstetric concerns, or go to [facility] for evaluation  

## 2016-11-10 LAB — GLUCOSE TOLERANCE, 1 HOUR (50G) W/O FASTING: Glucose, 1 Hr, gestational: 106 mg/dL (ref <140)

## 2016-11-10 LAB — ANTIBODY SCREEN: Antibody Screen: NEGATIVE

## 2016-11-10 LAB — RPR

## 2016-11-10 LAB — HIV ANTIBODY (ROUTINE TESTING W REFLEX): HIV: NONREACTIVE

## 2016-11-11 LAB — PAIN MGMT, PROFILE 6 CONF W/O MM, U
6 ACETYLMORPHINE: NEGATIVE ng/mL (ref <10)
ALCOHOL METABOLITES: NEGATIVE ng/mL (ref <500)
Amphetamines: NEGATIVE ng/mL (ref <500)
BARBITURATES: NEGATIVE ng/mL (ref <300)
Benzodiazepines: NEGATIVE ng/mL (ref <100)
COCAINE METABOLITE: NEGATIVE ng/mL (ref <150)
Creatinine: 160.6 mg/dL (ref >=20.0)
METHADONE METABOLITE: NEGATIVE ng/mL (ref <100)
Marijuana Metabolite: NEGATIVE ng/mL (ref <20)
OXYCODONE: NEGATIVE ng/mL (ref <100)
Opiates: NEGATIVE ng/mL (ref <100)
Oxidant: NEGATIVE ug/mL (ref <200)
PH: 8 (ref 4.5–9.0)
PHENCYCLIDINE: NEGATIVE ng/mL (ref <25)
PLEASE NOTE: 0

## 2016-11-24 ENCOUNTER — Ambulatory Visit (INDEPENDENT_AMBULATORY_CARE_PROVIDER_SITE_OTHER): Payer: Medicaid Other | Admitting: Student

## 2016-11-24 VITALS — BP 119/70 | HR 96 | Temp 98.5°F | Wt 189.3 lb

## 2016-11-24 DIAGNOSIS — Z3490 Encounter for supervision of normal pregnancy, unspecified, unspecified trimester: Secondary | ICD-10-CM | POA: Insufficient documentation

## 2016-11-24 DIAGNOSIS — Z98891 History of uterine scar from previous surgery: Secondary | ICD-10-CM

## 2016-11-24 DIAGNOSIS — Z3483 Encounter for supervision of other normal pregnancy, third trimester: Secondary | ICD-10-CM

## 2016-11-24 NOTE — Progress Notes (Signed)
Pt questioned how big or much baby is weighing.

## 2016-11-24 NOTE — Progress Notes (Signed)
   PRENATAL VISIT NOTE  Subjective:  Sabrina Bray is a 30 y.o. [id number] at 3121w1d being seen today for ongoing prenatal care.  She is currently monitored for the following issues for this low-risk pregnancy and has Encounter for supervision of normal pregnancy in multigravida; Previous cesarean section; and Positive urine drug screen on her problem list.  Patient reports no complaints.  Contractions: Irregular. Vag. Bleeding: None.  Movement: Present. Denies leaking of fluid.   The following portions of the patient's history were reviewed and updated as appropriate: allergies, current medications, past family history, past medical history, past social history, past surgical history and problem list. Problem list updated.  Objective:   Vitals:   11/24/16 1525  BP: 119/70  Pulse: 96  Temp: 98.5 F (36.9 C)  Weight: 85.9 kg (189 lb 4.8 oz)    Fetal Status: Fetal Heart Rate (bpm): 142 Fundal Height: 33 cm Movement: Present  Presentation: Vertex  General:  Alert, oriented and cooperative. Patient is in no acute distress.  Skin: Skin is warm and dry. No rash noted.   Cardiovascular: Normal heart rate noted  Respiratory: Normal respiratory effort, no problems with respiration noted  Abdomen: Soft, gravid, appropriate for gestational age. Pain/Pressure: Present     Pelvic:  Cervical exam deferred        Extremities: Normal range of motion.  Edema: Trace  Mental Status: Normal mood and affect. Normal behavior. Normal judgment and thought content.   Assessment and Plan:  Pregnancy: [id number] at 5221w1d feeling good. Discussed risk and benefits of C-section vs. Vbac, patient elects to schedule repeat C-section. Undecided about postpartum contraception. Will do GC CT and GBS cultures at next visit.    Term labor symptoms and general obstetric precautions including but not limited to vaginal bleeding, contractions, leaking of fluid and fetal movement were reviewed in detail with the  patient. Please refer to After Visit Summary for other counseling recommendations.  Return in about 2 weeks (around 12/08/2016) for ob fup.   Sabrina Bray, CNM

## 2016-12-14 ENCOUNTER — Ambulatory Visit (INDEPENDENT_AMBULATORY_CARE_PROVIDER_SITE_OTHER): Payer: Medicaid Other | Admitting: Family Medicine

## 2016-12-14 ENCOUNTER — Encounter: Payer: Medicaid Other | Admitting: Family Medicine

## 2016-12-14 ENCOUNTER — Other Ambulatory Visit (HOSPITAL_COMMUNITY)
Admission: RE | Admit: 2016-12-14 | Discharge: 2016-12-14 | Disposition: A | Discharge status: Home / Self Care | Payer: Medicaid Other | Source: Ambulatory Visit | Attending: Family Medicine | Admitting: Family Medicine

## 2016-12-14 VITALS — BP 135/78 | HR 88 | Wt 188.0 lb

## 2016-12-14 DIAGNOSIS — Z3483 Encounter for supervision of other normal pregnancy, third trimester: Secondary | ICD-10-CM | POA: Diagnosis not present

## 2016-12-14 DIAGNOSIS — N898 Other specified noninflammatory disorders of vagina: Secondary | ICD-10-CM | POA: Insufficient documentation

## 2016-12-14 DIAGNOSIS — O9989 Other specified diseases and conditions complicating pregnancy, childbirth and the puerperium: Secondary | ICD-10-CM | POA: Diagnosis not present

## 2016-12-14 DIAGNOSIS — Z98891 History of uterine scar from previous surgery: Secondary | ICD-10-CM

## 2016-12-14 DIAGNOSIS — Z3A37 37 weeks gestation of pregnancy: Secondary | ICD-10-CM | POA: Diagnosis not present

## 2016-12-14 DIAGNOSIS — Z113 Encounter for screening for infections with a predominantly sexual mode of transmission: Secondary | ICD-10-CM | POA: Diagnosis not present

## 2016-12-14 DIAGNOSIS — O34219 Maternal care for unspecified type scar from previous cesarean delivery: Secondary | ICD-10-CM

## 2016-12-14 DIAGNOSIS — Z3480 Encounter for supervision of other normal pregnancy, unspecified trimester: Secondary | ICD-10-CM

## 2016-12-14 DIAGNOSIS — O26893 Other specified pregnancy related conditions, third trimester: Secondary | ICD-10-CM | POA: Diagnosis not present

## 2016-12-14 MED ORDER — METRONIDAZOLE 500 MG PO TABS: 500.0000 mg | ORAL_TABLET | Freq: Two times a day (BID) | ORAL | 0 refills | Status: DC

## 2016-12-14 NOTE — Progress Notes (Signed)
   PRENATAL VISIT NOTE  Subjective:  Sabrina Bray is a 31 y.o. [id number] at 6722w0d being seen today for ongoing prenatal care.  She is currently monitored for the following issues for this high-risk pregnancy and has Encounter for supervision of normal pregnancy in multigravida; Previous cesarean section; and Positive urine drug screen on her problem list.  Patient reports no complaints.  Contractions: Irregular. Vag. Bleeding: None.  Movement: Present. Denies leaking of fluid.   The following portions of the patient's history were reviewed and updated as appropriate: allergies, current medications, past family history, past medical history, past social history, past surgical history and problem list. Problem list updated.  Objective:   Vitals:   12/14/16 1452  BP: 135/78  Pulse: 88  Weight: 188 lb (85.3 kg)    Fetal Status: Fetal Heart Rate (bpm): 145 Fundal Height: 36 cm Movement: Present  Presentation: Vertex  General:  Alert, oriented and cooperative. Patient is in no acute distress.  Skin: Skin is warm and dry. No rash noted.   Cardiovascular: Normal heart rate noted  Respiratory: Normal respiratory effort, no problems with respiration noted  Abdomen: Soft, gravid, appropriate for gestational age. Pain/Pressure: Present     Pelvic:  Cervical exam deferred        Extremities: Normal range of motion.  Edema: None  Mental Status: Normal mood and affect. Normal behavior. Normal judgment and thought content.   Assessment and Plan:  Pregnancy: [id number] at 4222w0d  1. Encounter for supervision of normal pregnancy in multigravida Cultures today - Culture, beta strep (group b only)  2. Previous cesarean section Scheduled for RCS  3. Vaginal discharge Feels like BV--wet prep sent and rx for Flagyl - metroNIDAZOLE (FLAGYL) 500 MG tablet; Take 1 tablet (500 mg total) by mouth 2 (two) times daily.  Dispense: 14 tablet; Refill: 0 - Cervicovaginal ancillary only  Term labor symptoms  and general obstetric precautions including but not limited to vaginal bleeding, contractions, leaking of fluid and fetal movement were reviewed in detail with the patient. Please refer to After Visit Summary for other counseling recommendations.  Return in 1 week (on 12/21/2016).   Reva Boresanya S Kyliah Deanda, MD

## 2016-12-14 NOTE — Progress Notes (Signed)
Patient reports decrease in movement over past month. Patient reports movement every hour but not like earlier in her pregnancy

## 2016-12-14 NOTE — Patient Instructions (Signed)
Third Trimester of Pregnancy The third trimester is from week 29 through week 40 (months 7 through 9). The third trimester is a time when the unborn baby (fetus) is growing rapidly. At the end of the ninth month, the fetus is about 20 inches in length and weighs 6-10 pounds. Body changes during your third trimester Your body goes through many changes during pregnancy. The changes vary from woman to woman. During the third trimester:  Your weight will continue to increase. You can expect to gain 25-35 pounds (11-16 kg) by the end of the pregnancy.  You may begin to get stretch marks on your hips, abdomen, and breasts.  You may urinate more often because the fetus is moving lower into your pelvis and pressing on your bladder.  You may develop or continue to have heartburn. This is caused by increased hormones that slow down muscles in the digestive tract.  You may develop or continue to have constipation because increased hormones slow digestion and cause the muscles that push waste through your intestines to relax.  You may develop hemorrhoids. These are swollen veins (varicose veins) in the rectum that can itch or be painful.  You may develop swollen, bulging veins (varicose veins) in your legs.  You may have increased body aches in the pelvis, back, or thighs. This is due to weight gain and increased hormones that are relaxing your joints.  You may have changes in your hair. These can include thickening of your hair, rapid growth, and changes in texture. Some women also have hair loss during or after pregnancy, or hair that feels dry or thin. Your hair will most likely return to normal after your baby is born.  Your breasts will continue to grow and they will continue to become tender. A yellow fluid (colostrum) may leak from your breasts. This is the first milk you are producing for your baby.  Your belly button may stick out.  You may notice more swelling in your hands, face, or  ankles.  You may have increased tingling or numbness in your hands, arms, and legs. The skin on your belly may also feel numb.  You may feel short of breath because of your expanding uterus.  You may have more problems sleeping. This can be caused by the size of your belly, increased need to urinate, and an increase in your body's metabolism.  You may notice the fetus "dropping," or moving lower in your abdomen.  You may have increased vaginal discharge.  Your cervix becomes thin and soft (effaced) near your due date. What to expect at prenatal visits You will have prenatal exams every 2 weeks until week 36. Then you will have weekly prenatal exams. During a routine prenatal visit:  You will be weighed to make sure you and the fetus are growing normally.  Your blood pressure will be taken.  Your abdomen will be measured to track your baby's growth.  The fetal heartbeat will be listened to.  Any test results from the previous visit will be discussed.  You may have a cervical check near your due date to see if you have effaced. At around 36 weeks, your health care provider will check your cervix. At the same time, your health care provider will also perform a test on the secretions of the vaginal tissue. This test is to determine if a type of bacteria, Group B streptococcus, is present. Your health care provider will explain this further. Your health care provider may ask you:    What your birth plan is.  How you are feeling.  If you are feeling the baby move.  If you have had any abnormal symptoms, such as leaking fluid, bleeding, severe headaches, or abdominal cramping.  If you are using any tobacco products, including cigarettes, chewing tobacco, and electronic cigarettes.  If you have any questions. Other tests or screenings that may be performed during your third trimester include:  Blood tests that check for low iron levels (anemia).  Fetal testing to check the health,  activity level, and growth of the fetus. Testing is done if you have certain medical conditions or if there are problems during the pregnancy.  Nonstress test (NST). This test checks the health of your baby to make sure there are no signs of problems, such as the baby not getting enough oxygen. During this test, a belt is placed around your belly. The baby is made to move, and its heart rate is monitored during movement. What is false labor? False labor is a condition in which you feel small, irregular tightenings of the muscles in the womb (contractions) that eventually go away. These are called Braxton Hicks contractions. Contractions may last for hours, days, or even weeks before true labor sets in. If contractions come at regular intervals, become more frequent, increase in intensity, or become painful, you should see your health care provider. What are the signs of labor?  Abdominal cramps.  Regular contractions that start at 10 minutes apart and become stronger and more frequent with time.  Contractions that start on the top of the uterus and spread down to the lower abdomen and back.  Increased pelvic pressure and dull back pain.  A watery or bloody mucus discharge that comes from the vagina.  Leaking of amniotic fluid. This is also known as your "water breaking." It could be a slow trickle or a gush. Let your doctor know if it has a color or strange odor. If you have any of these signs, call your health care provider right away, even if it is before your due date. Follow these instructions at home: Eating and drinking  Continue to eat regular, healthy meals.  Do not eat:  Raw meat or meat spreads.  Unpasteurized milk or cheese.  Unpasteurized juice.  Store-made salad.  Refrigerated smoked seafood.  Hot dogs or deli meat, unless they are piping hot.  More than 6 ounces of albacore tuna a week.  Shark, swordfish, king mackerel, or tile fish.  Store-made salads.  Raw  sprouts, such as mung bean or alfalfa sprouts.  Take prenatal vitamins as told by your health care provider.  Take 1000 mg of calcium daily as told by your health care provider.  If you develop constipation:  Take over-the-counter or prescription medicines.  Drink enough fluid to keep your urine clear or pale yellow.  Eat foods that are high in fiber, such as fresh fruits and vegetables, whole grains, and beans.  Limit foods that are high in fat and processed sugars, such as fried and sweet foods. Activity  Exercise only as directed by your health care provider. Healthy pregnant women should aim for 2 hours and 30 minutes of moderate exercise per week. If you experience any pain or discomfort while exercising, stop.  Avoid heavy lifting.  Do not exercise in extreme heat or humidity, or at high altitudes.  Wear low-heel, comfortable shoes.  Practice good posture.  Do not travel far distances unless it is absolutely necessary and only with the approval   of your health care provider.  Wear your seat belt at all times while in a car, on a bus, or on a plane.  Take frequent breaks and rest with your legs elevated if you have leg cramps or low back pain.  Do not use hot tubs, steam rooms, or saunas.  You may continue to have sex unless your health care provider tells you otherwise. Lifestyle  Do not use any products that contain nicotine or tobacco, such as cigarettes and e-cigarettes. If you need help quitting, ask your health care provider.  Do not drink alcohol.  Do not use any medicinal herbs or unprescribed drugs. These chemicals affect the formation and growth of the baby.  If you develop varicose veins:  Wear support pantyhose or compression stockings as told by your healthcare provider.  Elevate your feet for 15 minutes, 3-4 times a day.  Wear a supportive maternity bra to help with breast tenderness. General instructions  Take over-the-counter and prescription  medicines only as told by your health care provider. There are medicines that are either safe or unsafe to take during pregnancy.  Take warm sitz baths to soothe any pain or discomfort caused by hemorrhoids. Use hemorrhoid cream or witch hazel if your health care provider approves.  Avoid cat litter boxes and soil used by cats. These carry germs that can cause birth defects in the baby. If you have a cat, ask someone to clean the litter box for you.  To prepare for the arrival of your baby:  Take prenatal classes to understand, practice, and ask questions about the labor and delivery.  Make a trial run to the hospital.  Visit the hospital and tour the maternity area.  Arrange for maternity or paternity leave through employers.  Arrange for family and friends to take care of pets while you are in the hospital.  Purchase a rear-facing car seat and make sure you know how to install it in your car.  Pack your hospital bag.  Prepare the baby's nursery. Make sure to remove all pillows and stuffed animals from the baby's crib to prevent suffocation.  Visit your dentist if you have not gone during your pregnancy. Use a soft toothbrush to brush your teeth and be gentle when you floss.  Keep all prenatal follow-up visits as told by your health care provider. This is important. Contact a health care provider if:  You are unsure if you are in labor or if your water has broken.  You become dizzy.  You have mild pelvic cramps, pelvic pressure, or nagging pain in your abdominal area.  You have lower back pain.  You have persistent nausea, vomiting, or diarrhea.  You have an unusual or bad smelling vaginal discharge.  You have pain when you urinate. Get help right away if:  You have a fever.  You are leaking fluid from your vagina.  You have spotting or bleeding from your vagina.  You have severe abdominal pain or cramping.  You have rapid weight loss or weight gain.  You have  shortness of breath with chest pain.  You notice sudden or extreme swelling of your face, hands, ankles, feet, or legs.  Your baby makes fewer than 10 movements in 2 hours.  You have severe headaches that do not go away with medicine.  You have vision changes. Summary  The third trimester is from week 29 through week 40, months 7 through 9. The third trimester is a time when the unborn baby (fetus)   is growing rapidly.  During the third trimester, your discomfort may increase as you and your baby continue to gain weight. You may have abdominal, leg, and back pain, sleeping problems, and an increased need to urinate.  During the third trimester your breasts will keep growing and they will continue to become tender. A yellow fluid (colostrum) may leak from your breasts. This is the first milk you are producing for your baby.  False labor is a condition in which you feel small, irregular tightenings of the muscles in the womb (contractions) that eventually go away. These are called Braxton Hicks contractions. Contractions may last for hours, days, or even weeks before true labor sets in.  Signs of labor can include: abdominal cramps; regular contractions that start at 10 minutes apart and become stronger and more frequent with time; watery or bloody mucus discharge that comes from the vagina; increased pelvic pressure and dull back pain; and leaking of amniotic fluid. This information is not intended to replace advice given to you by your health care provider. Make sure you discuss any questions you have with your health care provider. Document Released: 11/10/2001 Document Revised: 04/23/2016 Document Reviewed: 01/17/2013 Elsevier Interactive Patient Education  2017 [employer]  

## 2016-12-15 ENCOUNTER — Encounter: Payer: Self-pay | Admitting: General Practice

## 2016-12-15 LAB — CERVICOVAGINAL ANCILLARY ONLY
BACTERIAL VAGINITIS: NEGATIVE
Candida vaginitis: POSITIVE — AB
Chlamydia: NEGATIVE
NEISSERIA GONORRHEA: NEGATIVE
Trichomonas: NEGATIVE

## 2016-12-15 MED ORDER — TERCONAZOLE 0.8 % VA CREA: 1.0000 | TOPICAL_CREAM | Freq: Every day | VAGINAL | 0 refills | Status: AC

## 2016-12-15 NOTE — Addendum Note (Signed)
Addended by: Kathee DeltonHILLMAN, Aviyanna Colbaugh L on: 12/15/2016 04:34 PM   Modules accepted: Orders

## 2016-12-16 LAB — CULTURE, BETA STREP (GROUP B ONLY)

## 2016-12-18 ENCOUNTER — Encounter (HOSPITAL_COMMUNITY): Payer: Self-pay

## 2016-12-23 ENCOUNTER — Ambulatory Visit (INDEPENDENT_AMBULATORY_CARE_PROVIDER_SITE_OTHER): Payer: Medicaid Other | Admitting: Student

## 2016-12-23 VITALS — BP 137/77 | HR 83 | Wt 186.0 lb

## 2016-12-23 DIAGNOSIS — Z3483 Encounter for supervision of other normal pregnancy, third trimester: Secondary | ICD-10-CM

## 2016-12-23 DIAGNOSIS — Z3480 Encounter for supervision of other normal pregnancy, unspecified trimester: Secondary | ICD-10-CM

## 2016-12-23 NOTE — Patient Instructions (Signed)
Third Trimester of Pregnancy The third trimester is from week 29 through week 40 (months 7 through 9). The third trimester is a time when the unborn baby (fetus) is growing rapidly. At the end of the ninth month, the fetus is about 20 inches in length and weighs 6-10 pounds. Body changes during your third trimester Your body goes through many changes during pregnancy. The changes vary from woman to woman. During the third trimester:  Your weight will continue to increase. You can expect to gain 25-35 pounds (11-16 kg) by the end of the pregnancy.  You may begin to get stretch marks on your hips, abdomen, and breasts.  You may urinate more often because the fetus is moving lower into your pelvis and pressing on your bladder.  You may develop or continue to have heartburn. This is caused by increased hormones that slow down muscles in the digestive tract.  You may develop or continue to have constipation because increased hormones slow digestion and cause the muscles that push waste through your intestines to relax.  You may develop hemorrhoids. These are swollen veins (varicose veins) in the rectum that can itch or be painful.  You may develop swollen, bulging veins (varicose veins) in your legs.  You may have increased body aches in the pelvis, back, or thighs. This is due to weight gain and increased hormones that are relaxing your joints.  You may have changes in your hair. These can include thickening of your hair, rapid growth, and changes in texture. Some women also have hair loss during or after pregnancy, or hair that feels dry or thin. Your hair will most likely return to normal after your baby is born.  Your breasts will continue to grow and they will continue to become tender. A yellow fluid (colostrum) may leak from your breasts. This is the first milk you are producing for your baby.  Your belly button may stick out.  You may notice more swelling in your hands, face, or  ankles.  You may have increased tingling or numbness in your hands, arms, and legs. The skin on your belly may also feel numb.  You may feel short of breath because of your expanding uterus.  You may have more problems sleeping. This can be caused by the size of your belly, increased need to urinate, and an increase in your body's metabolism.  You may notice the fetus "dropping," or moving lower in your abdomen.  You may have increased vaginal discharge.  Your cervix becomes thin and soft (effaced) near your due date. What to expect at prenatal visits You will have prenatal exams every 2 weeks until week 36. Then you will have weekly prenatal exams. During a routine prenatal visit:  You will be weighed to make sure you and the fetus are growing normally.  Your blood pressure will be taken.  Your abdomen will be measured to track your baby's growth.  The fetal heartbeat will be listened to.  Any test results from the previous visit will be discussed.  You may have a cervical check near your due date to see if you have effaced. At around 36 weeks, your health care provider will check your cervix. At the same time, your health care provider will also perform a test on the secretions of the vaginal tissue. This test is to determine if a type of bacteria, Group B streptococcus, is present. Your health care provider will explain this further. Your health care provider may ask you:    What your birth plan is.  How you are feeling.  If you are feeling the baby move.  If you have had any abnormal symptoms, such as leaking fluid, bleeding, severe headaches, or abdominal cramping.  If you are using any tobacco products, including cigarettes, chewing tobacco, and electronic cigarettes.  If you have any questions. Other tests or screenings that may be performed during your third trimester include:  Blood tests that check for low iron levels (anemia).  Fetal testing to check the health,  activity level, and growth of the fetus. Testing is done if you have certain medical conditions or if there are problems during the pregnancy.  Nonstress test (NST). This test checks the health of your baby to make sure there are no signs of problems, such as the baby not getting enough oxygen. During this test, a belt is placed around your belly. The baby is made to move, and its heart rate is monitored during movement. What is false labor? False labor is a condition in which you feel small, irregular tightenings of the muscles in the womb (contractions) that eventually go away. These are called Braxton Hicks contractions. Contractions may last for hours, days, or even weeks before true labor sets in. If contractions come at regular intervals, become more frequent, increase in intensity, or become painful, you should see your health care provider. What are the signs of labor?  Abdominal cramps.  Regular contractions that start at 10 minutes apart and become stronger and more frequent with time.  Contractions that start on the top of the uterus and spread down to the lower abdomen and back.  Increased pelvic pressure and dull back pain.  A watery or bloody mucus discharge that comes from the vagina.  Leaking of amniotic fluid. This is also known as your "water breaking." It could be a slow trickle or a gush. Let your doctor know if it has a color or strange odor. If you have any of these signs, call your health care provider right away, even if it is before your due date. Follow these instructions at home: Eating and drinking  Continue to eat regular, healthy meals.  Do not eat:  Raw meat or meat spreads.  Unpasteurized milk or cheese.  Unpasteurized juice.  Store-made salad.  Refrigerated smoked seafood.  Hot dogs or deli meat, unless they are piping hot.  More than 6 ounces of albacore tuna a week.  Shark, swordfish, king mackerel, or tile fish.  Store-made salads.  Raw  sprouts, such as mung bean or alfalfa sprouts.  Take prenatal vitamins as told by your health care provider.  Take 1000 mg of calcium daily as told by your health care provider.  If you develop constipation:  Take over-the-counter or prescription medicines.  Drink enough fluid to keep your urine clear or pale yellow.  Eat foods that are high in fiber, such as fresh fruits and vegetables, whole grains, and beans.  Limit foods that are high in fat and processed sugars, such as fried and sweet foods. Activity  Exercise only as directed by your health care provider. Healthy pregnant women should aim for 2 hours and 30 minutes of moderate exercise per week. If you experience any pain or discomfort while exercising, stop.  Avoid heavy lifting.  Do not exercise in extreme heat or humidity, or at high altitudes.  Wear low-heel, comfortable shoes.  Practice good posture.  Do not travel far distances unless it is absolutely necessary and only with the approval   of your health care provider.  Wear your seat belt at all times while in a car, on a bus, or on a plane.  Take frequent breaks and rest with your legs elevated if you have leg cramps or low back pain.  Do not use hot tubs, steam rooms, or saunas.  You may continue to have sex unless your health care provider tells you otherwise. Lifestyle  Do not use any products that contain nicotine or tobacco, such as cigarettes and e-cigarettes. If you need help quitting, ask your health care provider.  Do not drink alcohol.  Do not use any medicinal herbs or unprescribed drugs. These chemicals affect the formation and growth of the baby.  If you develop varicose veins:  Wear support pantyhose or compression stockings as told by your healthcare provider.  Elevate your feet for 15 minutes, 3-4 times a day.  Wear a supportive maternity bra to help with breast tenderness. General instructions  Take over-the-counter and prescription  medicines only as told by your health care provider. There are medicines that are either safe or unsafe to take during pregnancy.  Take warm sitz baths to soothe any pain or discomfort caused by hemorrhoids. Use hemorrhoid cream or witch hazel if your health care provider approves.  Avoid cat litter boxes and soil used by cats. These carry germs that can cause birth defects in the baby. If you have a cat, ask someone to clean the litter box for you.  To prepare for the arrival of your baby:  Take prenatal classes to understand, practice, and ask questions about the labor and delivery.  Make a trial run to the hospital.  Visit the hospital and tour the maternity area.  Arrange for maternity or paternity leave through employers.  Arrange for family and friends to take care of pets while you are in the hospital.  Purchase a rear-facing car seat and make sure you know how to install it in your car.  Pack your hospital bag.  Prepare the baby's nursery. Make sure to remove all pillows and stuffed animals from the baby's crib to prevent suffocation.  Visit your dentist if you have not gone during your pregnancy. Use a soft toothbrush to brush your teeth and be gentle when you floss.  Keep all prenatal follow-up visits as told by your health care provider. This is important. Contact a health care provider if:  You are unsure if you are in labor or if your water has broken.  You become dizzy.  You have mild pelvic cramps, pelvic pressure, or nagging pain in your abdominal area.  You have lower back pain.  You have persistent nausea, vomiting, or diarrhea.  You have an unusual or bad smelling vaginal discharge.  You have pain when you urinate. Get help right away if:  You have a fever.  You are leaking fluid from your vagina.  You have spotting or bleeding from your vagina.  You have severe abdominal pain or cramping.  You have rapid weight loss or weight gain.  You have  shortness of breath with chest pain.  You notice sudden or extreme swelling of your face, hands, ankles, feet, or legs.  Your baby makes fewer than 10 movements in 2 hours.  You have severe headaches that do not go away with medicine.  You have vision changes. Summary  The third trimester is from week 29 through week 40, months 7 through 9. The third trimester is a time when the unborn baby (fetus)   is growing rapidly.  During the third trimester, your discomfort may increase as you and your baby continue to gain weight. You may have abdominal, leg, and back pain, sleeping problems, and an increased need to urinate.  During the third trimester your breasts will keep growing and they will continue to become tender. A yellow fluid (colostrum) may leak from your breasts. This is the first milk you are producing for your baby.  False labor is a condition in which you feel small, irregular tightenings of the muscles in the womb (contractions) that eventually go away. These are called Braxton Hicks contractions. Contractions may last for hours, days, or even weeks before true labor sets in.  Signs of labor can include: abdominal cramps; regular contractions that start at 10 minutes apart and become stronger and more frequent with time; watery or bloody mucus discharge that comes from the vagina; increased pelvic pressure and dull back pain; and leaking of amniotic fluid. This information is not intended to replace advice given to you by your health care provider. Make sure you discuss any questions you have with your health care provider. Document Released: 11/10/2001 Document Revised: 04/23/2016 Document Reviewed: 01/17/2013 Elsevier Interactive Patient Education  2017 [employer]  

## 2016-12-23 NOTE — Progress Notes (Signed)
   PRENATAL VISIT NOTE  Subjective:  Sabrina Bray is a 31 y.o. [id number] at 5135w2d being seen today for ongoing prenatal care.  She is currently monitored for the following issues for this low-risk pregnancy and has Encounter for supervision of normal pregnancy in multigravida; Previous cesarean section; and Positive urine drug screen on her problem list.  Patient reports no complaints.  Contractions: Irregular. Vag. Bleeding: None.  Movement: Present. Denies leaking of fluid.   The following portions of the patient's history were reviewed and updated as appropriate: allergies, current medications, past family history, past medical history, past social history, past surgical history and problem list. Problem list updated.  Objective:   Vitals:   12/23/16 1543  BP: 137/77  Pulse: 83  Weight: 186 lb (84.4 kg)    Fetal Status: Fetal Heart Rate (bpm): 126 Fundal Height: 39 cm Movement: Present     General:  Alert, oriented and cooperative. Patient is in no acute distress.  Skin: Skin is warm and dry. No rash noted.   Cardiovascular: Normal heart rate noted  Respiratory: Normal respiratory effort, no problems with respiration noted  Abdomen: Soft, gravid, appropriate for gestational age. Pain/Pressure: Present     Pelvic:  Cervical exam deferred        Extremities: Normal range of motion.  Edema: None  Mental Status: Normal mood and affect. Normal behavior. Normal judgment and thought content.   Assessment and Plan:  Pregnancy: [id number] at 4035w2d  1. Encounter for supervision of normal pregnancy in multigravida Patient is doing well; happy for her scheduled c-section on 1/29. She reports positive fetal movements.   2. Patient is undecided about contraception. She does not want to take hormones, although I counseled her that LARC methods are very effective and the side effects can be temporary. For now she wants to use condoms with her husband post partum.   Term labor symptoms and  general obstetric precautions including but not limited to vaginal bleeding, contractions, leaking of fluid and fetal movement were reviewed in detail with the patient. Please refer to After Visit Summary for other counseling recommendations.  Return if symptoms worsen or fail to improve.   Marylene LandKathryn Lorraine Akane Tessier, CNM

## 2016-12-25 ENCOUNTER — Encounter (HOSPITAL_COMMUNITY)
Admission: RE | Admit: 2016-12-25 | Discharge: 2016-12-25 | Disposition: A | Discharge status: Home / Self Care | Payer: Medicaid Other | Source: Ambulatory Visit | Attending: Obstetrics & Gynecology | Admitting: Obstetrics & Gynecology

## 2016-12-25 LAB — CBC
HEMATOCRIT: 30 % — AB (ref 36.0–46.0)
HEMOGLOBIN: 10.3 g/dL — AB (ref 12.0–15.0)
MCH: 28.9 pg (ref 26.0–34.0)
MCHC: 34.3 g/dL (ref 30.0–36.0)
MCV: 84.3 fL (ref 78.0–100.0)
Platelets: 156 10*3/uL (ref 150–400)
RBC: 3.56 MIL/uL — AB (ref 3.87–5.11)
RDW: 13.5 % (ref 11.5–15.5)
WBC: 7.2 10*3/uL (ref 4.0–10.5)

## 2016-12-25 LAB — TYPE AND SCREEN
ABO/RH(D): O POS
ANTIBODY SCREEN: NEGATIVE

## 2016-12-25 NOTE — Patient Instructions (Signed)
19 Sabrina Bray  12/25/2016   Your procedure is scheduled on:  12/28/2016  [facility].  Pick up the phone at the desk and dial [phone] .   Call this number if you have problems the morning of surgery: [phone]   Remember:   Do not eat food:After Midnight.  Do not drink clear liquids: After Midnight.  Take these medicines the morning of surgery with A SIP OF WATER: NONE   Do not wear jewelry, make-up or nail polish.  Do not wear lotions, powders, or perfumes. Do not wear deodorant.  Do not shave 48 hours prior to surgery.  Do not bring valuables to the hospital.  [facility] not   responsible for any belongings or valuables brought to the hospital.  Contacts, dentures or bridgework may not be worn into surgery.  Leave suitcase in the car. After surgery it may be brought to your room.  For patients admitted to the hospital, checkout time is 11:00 AM the day of              discharge.   Patients discharged the day of surgery will not be allowed to drive             home.  Name and phone number of your driver: NA  Special Instructions:   N/A   Please read over the following fact sheets that you were given:   Surgical Site Infection Prevention

## 2016-12-26 LAB — RPR: RPR Ser Ql: NONREACTIVE

## 2016-12-28 ENCOUNTER — Inpatient Hospital Stay (HOSPITAL_COMMUNITY): Payer: Medicaid Other | Admitting: Certified Registered Nurse Anesthetist

## 2016-12-28 ENCOUNTER — Inpatient Hospital Stay (HOSPITAL_COMMUNITY)
Admission: RE | Admit: 2016-12-28 | Discharge: 2016-12-30 | DRG: 766 | Disposition: A | Discharge status: Home / Self Care | Payer: Medicaid Other | Source: Ambulatory Visit | Attending: Family Medicine | Admitting: Family Medicine

## 2016-12-28 ENCOUNTER — Encounter (HOSPITAL_COMMUNITY)
Admission: RE | Disposition: A | Discharge status: Home / Self Care | Payer: Self-pay | Source: Ambulatory Visit | Attending: Obstetrics & Gynecology

## 2016-12-28 ENCOUNTER — Other Ambulatory Visit: Payer: Self-pay | Admitting: Obstetrics & Gynecology

## 2016-12-28 ENCOUNTER — Encounter (HOSPITAL_COMMUNITY): Payer: Self-pay | Admitting: *Deleted

## 2016-12-28 DIAGNOSIS — O99334 Smoking (tobacco) complicating childbirth: Secondary | ICD-10-CM | POA: Diagnosis present

## 2016-12-28 DIAGNOSIS — Z98891 History of uterine scar from previous surgery: Secondary | ICD-10-CM

## 2016-12-28 DIAGNOSIS — O34211 Maternal care for low transverse scar from previous cesarean delivery: Principal | ICD-10-CM | POA: Diagnosis present

## 2016-12-28 DIAGNOSIS — R825 Elevated urine levels of drugs, medicaments and biological substances: Secondary | ICD-10-CM

## 2016-12-28 DIAGNOSIS — Z3A39 39 weeks gestation of pregnancy: Secondary | ICD-10-CM

## 2016-12-28 DIAGNOSIS — Z8249 Family history of ischemic heart disease and other diseases of the circulatory system: Secondary | ICD-10-CM

## 2016-12-28 DIAGNOSIS — F17210 Nicotine dependence, cigarettes, uncomplicated: Secondary | ICD-10-CM | POA: Diagnosis not present

## 2016-12-28 DIAGNOSIS — Z3480 Encounter for supervision of other normal pregnancy, unspecified trimester: Secondary | ICD-10-CM

## 2016-12-28 SURGERY — Surgical Case
Anesthesia: Spinal | Site: Abdomen | Laterality: Bilateral | Wound class: Clean Contaminated

## 2016-12-28 MED ORDER — OXYTOCIN 10 UNIT/ML IJ SOLN
INTRAMUSCULAR | Status: AC
  Filled 2016-12-28: qty 4

## 2016-12-28 MED ORDER — KETOROLAC TROMETHAMINE 30 MG/ML IJ SOLN
INTRAMUSCULAR | Status: AC
  Filled 2016-12-28: qty 1

## 2016-12-28 MED ORDER — IBUPROFEN 600 MG PO TABS
600.0000 mg | ORAL_TABLET | Freq: Four times a day (QID) | ORAL | Status: DC
  Administered 2016-12-29 – 2016-12-30 (×5): 600 mg via ORAL
  Filled 2016-12-28 (×6): qty 1

## 2016-12-28 MED ORDER — FENTANYL CITRATE (PF) 100 MCG/2ML IJ SOLN
INTRAMUSCULAR | Status: AC
  Filled 2016-12-28: qty 2

## 2016-12-28 MED ORDER — MORPHINE SULFATE (PF) 0.5 MG/ML IJ SOLN
INTRAMUSCULAR | Status: AC
  Filled 2016-12-28: qty 10

## 2016-12-28 MED ORDER — BUPIVACAINE HCL (PF) 0.5 % IJ SOLN
INTRAMUSCULAR | Status: AC
  Filled 2016-12-28: qty 30

## 2016-12-28 MED ORDER — NALBUPHINE HCL 10 MG/ML IJ SOLN: 5.0000 mg | Freq: Once | INTRAMUSCULAR | Status: DC | PRN

## 2016-12-28 MED ORDER — SODIUM CHLORIDE 0.9% FLUSH: 3.0000 mL | INTRAVENOUS | Status: DC | PRN

## 2016-12-28 MED ORDER — WITCH HAZEL-GLYCERIN EX PADS: 1.0000 "application " | MEDICATED_PAD | CUTANEOUS | Status: DC | PRN

## 2016-12-28 MED ORDER — MEPERIDINE HCL 25 MG/ML IJ SOLN: 6.2500 mg | INTRAMUSCULAR | Status: DC | PRN

## 2016-12-28 MED ORDER — TETANUS-DIPHTH-ACELL PERTUSSIS 5-2.5-18.5 LF-MCG/0.5 IM SUSP
0.5000 mL | Freq: Once | INTRAMUSCULAR | Status: DC
  Filled 2016-12-28: qty 0.5

## 2016-12-28 MED ORDER — NALBUPHINE HCL 10 MG/ML IJ SOLN: 5.0000 mg | INTRAMUSCULAR | Status: DC | PRN

## 2016-12-28 MED ORDER — SIMETHICONE 80 MG PO CHEW
80.0000 mg | CHEWABLE_TABLET | Freq: Three times a day (TID) | ORAL | Status: DC
  Administered 2016-12-29 – 2016-12-30 (×5): 80 mg via ORAL
  Filled 2016-12-28 (×10): qty 1

## 2016-12-28 MED ORDER — OXYTOCIN 10 UNIT/ML IJ SOLN
INTRAMUSCULAR | Status: DC | PRN
  Administered 2016-12-28: 40 [IU] via INTRAVENOUS

## 2016-12-28 MED ORDER — MORPHINE SULFATE (PF) 0.5 MG/ML IJ SOLN
INTRAMUSCULAR | Status: DC | PRN
  Administered 2016-12-28: .2 mg via INTRATHECAL

## 2016-12-28 MED ORDER — DEXTROSE 5 % IV SOLN: 2.0000 g | Freq: Once | INTRAVENOUS | Status: DC

## 2016-12-28 MED ORDER — ZOLPIDEM TARTRATE 5 MG PO TABS: 5.0000 mg | ORAL_TABLET | Freq: Every evening | ORAL | Status: DC | PRN

## 2016-12-28 MED ORDER — COCONUT OIL OIL
1.0000 "application " | TOPICAL_OIL | Status: DC | PRN
  Filled 2016-12-28: qty 120

## 2016-12-28 MED ORDER — SENNOSIDES-DOCUSATE SODIUM 8.6-50 MG PO TABS
2.0000 | ORAL_TABLET | ORAL | Status: DC
  Administered 2016-12-28 – 2016-12-29 (×2): 2 via ORAL
  Filled 2016-12-28 (×4): qty 2

## 2016-12-28 MED ORDER — OXYTOCIN 40 UNITS IN LACTATED RINGERS INFUSION - SIMPLE MED: 2.5000 [IU]/h | INTRAVENOUS | Status: AC

## 2016-12-28 MED ORDER — SIMETHICONE 80 MG PO CHEW
80.0000 mg | CHEWABLE_TABLET | ORAL | Status: DC
  Administered 2016-12-28 – 2016-12-29 (×2): 80 mg via ORAL
  Filled 2016-12-28 (×4): qty 1

## 2016-12-28 MED ORDER — LACTATED RINGERS IV SOLN
INTRAVENOUS | Status: DC | PRN
  Administered 2016-12-28: 13:00:00 via INTRAVENOUS

## 2016-12-28 MED ORDER — LACTATED RINGERS IV BOLUS (SEPSIS)
500.0000 mL | Freq: Once | INTRAVENOUS | Status: AC
  Administered 2016-12-28: 500 mL via INTRAVENOUS

## 2016-12-28 MED ORDER — DIPHENHYDRAMINE HCL 25 MG PO CAPS
25.0000 mg | ORAL_CAPSULE | ORAL | Status: DC | PRN
  Filled 2016-12-28: qty 1

## 2016-12-28 MED ORDER — BUPIVACAINE HCL (PF) 0.5 % IJ SOLN
INTRAMUSCULAR | Status: DC | PRN
  Administered 2016-12-28: 30 mL

## 2016-12-28 MED ORDER — METOCLOPRAMIDE HCL 5 MG/ML IJ SOLN: 10.0000 mg | Freq: Once | INTRAMUSCULAR | Status: DC

## 2016-12-28 MED ORDER — LACTATED RINGERS IV SOLN
INTRAVENOUS | Status: DC
  Administered 2016-12-28 (×2): via INTRAVENOUS

## 2016-12-28 MED ORDER — DIPHENHYDRAMINE HCL 25 MG PO CAPS
25.0000 mg | ORAL_CAPSULE | Freq: Four times a day (QID) | ORAL | Status: DC | PRN
  Filled 2016-12-28: qty 1

## 2016-12-28 MED ORDER — DIBUCAINE 1 % RE OINT: 1.0000 "application " | TOPICAL_OINTMENT | RECTAL | Status: DC | PRN

## 2016-12-28 MED ORDER — HYDROMORPHONE HCL 1 MG/ML IJ SOLN: 0.2500 mg | INTRAMUSCULAR | Status: DC | PRN

## 2016-12-28 MED ORDER — OXYCODONE HCL 5 MG PO TABS
5.0000 mg | ORAL_TABLET | ORAL | Status: DC | PRN
  Administered 2016-12-29 – 2016-12-30 (×7): 5 mg via ORAL
  Filled 2016-12-28 (×7): qty 1

## 2016-12-28 MED ORDER — SIMETHICONE 80 MG PO CHEW
80.0000 mg | CHEWABLE_TABLET | ORAL | Status: DC | PRN
  Filled 2016-12-28: qty 1

## 2016-12-28 MED ORDER — ACETAMINOPHEN 325 MG PO TABS
650.0000 mg | ORAL_TABLET | ORAL | Status: DC | PRN
  Administered 2016-12-30: 650 mg via ORAL
  Filled 2016-12-28: qty 2

## 2016-12-28 MED ORDER — MENTHOL 3 MG MT LOZG
1.0000 | LOZENGE | OROMUCOSAL | Status: DC | PRN
  Filled 2016-12-28: qty 9

## 2016-12-28 MED ORDER — CEFAZOLIN SODIUM-DEXTROSE 2-4 GM/100ML-% IV SOLN
2.0000 g | Freq: Once | INTRAVENOUS | Status: AC
  Administered 2016-12-28: 2 g via INTRAVENOUS

## 2016-12-28 MED ORDER — OXYCODONE HCL 5 MG PO TABS
10.0000 mg | ORAL_TABLET | ORAL | Status: DC | PRN
  Administered 2016-12-29: 10 mg via ORAL
  Filled 2016-12-28: qty 2

## 2016-12-28 MED ORDER — BUPIVACAINE IN DEXTROSE 0.75-8.25 % IT SOLN
INTRATHECAL | Status: DC | PRN
  Administered 2016-12-28: 1.6 mL via INTRATHECAL

## 2016-12-28 MED ORDER — ONDANSETRON HCL 4 MG/2ML IJ SOLN
INTRAMUSCULAR | Status: AC
  Filled 2016-12-28: qty 2

## 2016-12-28 MED ORDER — ONDANSETRON HCL 4 MG/2ML IJ SOLN
4.0000 mg | Freq: Three times a day (TID) | INTRAMUSCULAR | Status: DC | PRN
  Administered 2016-12-28: 4 mg via INTRAVENOUS
  Filled 2016-12-28: qty 2

## 2016-12-28 MED ORDER — SCOPOLAMINE 1 MG/3DAYS TD PT72: 1.0000 | MEDICATED_PATCH | Freq: Once | TRANSDERMAL | Status: DC

## 2016-12-28 MED ORDER — PHENYLEPHRINE 8 MG IN D5W 100 ML (0.08MG/ML) PREMIX OPTIME
INJECTION | INTRAVENOUS | Status: AC
  Filled 2016-12-28: qty 100

## 2016-12-28 MED ORDER — DIPHENHYDRAMINE HCL 50 MG/ML IJ SOLN: 12.5000 mg | INTRAMUSCULAR | Status: DC | PRN

## 2016-12-28 MED ORDER — PHENYLEPHRINE 8 MG IN D5W 100 ML (0.08MG/ML) PREMIX OPTIME
INJECTION | INTRAVENOUS | Status: DC | PRN
  Administered 2016-12-28: 60 ug/min via INTRAVENOUS

## 2016-12-28 MED ORDER — KETOROLAC TROMETHAMINE 30 MG/ML IJ SOLN
30.0000 mg | Freq: Four times a day (QID) | INTRAMUSCULAR | Status: AC | PRN
Start: 2016-12-28 — End: 2016-12-29
  Administered 2016-12-28 – 2016-12-29 (×2): 30 mg via INTRAVENOUS
  Filled 2016-12-28 (×2): qty 1

## 2016-12-28 MED ORDER — ONDANSETRON HCL 4 MG/2ML IJ SOLN
INTRAMUSCULAR | Status: DC | PRN
  Administered 2016-12-28: 4 mg via INTRAVENOUS

## 2016-12-28 MED ORDER — LACTATED RINGERS IV SOLN: INTRAVENOUS | Status: DC

## 2016-12-28 MED ORDER — KETOROLAC TROMETHAMINE 30 MG/ML IJ SOLN
30.0000 mg | Freq: Four times a day (QID) | INTRAMUSCULAR | Status: AC | PRN
Start: 2016-12-28 — End: 2016-12-29
  Administered 2016-12-28: 30 mg via INTRAMUSCULAR

## 2016-12-28 MED ORDER — PROMETHAZINE HCL 25 MG/ML IJ SOLN: 6.2500 mg | INTRAMUSCULAR | Status: DC | PRN

## 2016-12-28 MED ORDER — SCOPOLAMINE 1 MG/3DAYS TD PT72
1.0000 | MEDICATED_PATCH | TRANSDERMAL | Status: DC
  Administered 2016-12-28: 1.5 mg via TRANSDERMAL
  Filled 2016-12-28: qty 1

## 2016-12-28 MED ORDER — FENTANYL CITRATE (PF) 100 MCG/2ML IJ SOLN
INTRAMUSCULAR | Status: DC | PRN
  Administered 2016-12-28: 100 ug via INTRAVENOUS

## 2016-12-28 MED ORDER — NALOXONE HCL 0.4 MG/ML IJ SOLN: 0.4000 mg | INTRAMUSCULAR | Status: DC | PRN

## 2016-12-28 MED ORDER — PROMETHAZINE HCL 25 MG/ML IJ SOLN: 25.0000 mg | Freq: Once | INTRAMUSCULAR | Status: DC

## 2016-12-28 MED ORDER — PRENATAL MULTIVITAMIN CH
1.0000 | ORAL_TABLET | Freq: Every day | ORAL | Status: DC
  Administered 2016-12-29 – 2016-12-30 (×2): 1 via ORAL
  Filled 2016-12-28 (×4): qty 1

## 2016-12-28 MED ORDER — DEXTROSE 5 % IV SOLN
1.0000 ug/kg/h | INTRAVENOUS | Status: DC | PRN
  Filled 2016-12-28: qty 2

## 2016-12-28 SURGICAL SUPPLY — 30 items
BARRIER ADHS 3X4 INTERCEED (GAUZE/BANDAGES/DRESSINGS) IMPLANT
CHLORAPREP W/TINT 26ML (MISCELLANEOUS) ×3 IMPLANT
CLAMP CORD UMBIL (MISCELLANEOUS) IMPLANT
CLOTH BEACON ORANGE TIMEOUT ST (SAFETY) ×3 IMPLANT
DRSG OPSITE POSTOP 4X10 (GAUZE/BANDAGES/DRESSINGS) ×6 IMPLANT
ELECT REM PT RETURN 9FT ADLT (ELECTROSURGICAL) ×3
ELECTRODE REM PT RTRN 9FT ADLT (ELECTROSURGICAL) ×1 IMPLANT
EXTRACTOR VACUUM KIWI (MISCELLANEOUS) IMPLANT
GLOVE BIO SURGEON STRL SZ 6.5 (GLOVE) ×2 IMPLANT
GLOVE BIO SURGEONS STRL SZ 6.5 (GLOVE) ×1
GLOVE BIOGEL PI IND STRL 7.0 (GLOVE) ×2 IMPLANT
GLOVE BIOGEL PI INDICATOR 7.0 (GLOVE) ×4
GOWN STRL REUS W/TWL LRG LVL3 (GOWN DISPOSABLE) ×6 IMPLANT
KIT ABG SYR 3ML LUER SLIP (SYRINGE) IMPLANT
NEEDLE HYPO 22GX1.5 SAFETY (NEEDLE) IMPLANT
NEEDLE HYPO 25X5/8 SAFETYGLIDE (NEEDLE) IMPLANT
NS IRRIG 1000ML POUR BTL (IV SOLUTION) ×3 IMPLANT
PACK C SECTION WH (CUSTOM PROCEDURE TRAY) ×3 IMPLANT
PAD OB MATERNITY 4.3X12.25 (PERSONAL CARE ITEMS) ×3 IMPLANT
PENCIL SMOKE EVAC W/HOLSTER (ELECTROSURGICAL) ×3 IMPLANT
RETRACTOR WND ALEXIS 25 LRG (MISCELLANEOUS) IMPLANT
RTRCTR WOUND ALEXIS 25CM LRG (MISCELLANEOUS)
SPONGE GAUZE 4X4 12PLY STER LF (GAUZE/BANDAGES/DRESSINGS) ×3 IMPLANT
SUT VIC AB 0 CT1 36 (SUTURE) ×18 IMPLANT
SUT VIC AB 2-0 CT1 27 (SUTURE) ×2
SUT VIC AB 2-0 CT1 TAPERPNT 27 (SUTURE) ×1 IMPLANT
SUT VIC AB 4-0 PS2 27 (SUTURE) ×3 IMPLANT
SYR CONTROL 10ML LL (SYRINGE) IMPLANT
TOWEL OR 17X24 6PK STRL BLUE (TOWEL DISPOSABLE) ×3 IMPLANT
TRAY FOLEY CATH SILVER 14FR (SET/KITS/TRAYS/PACK) IMPLANT

## 2016-12-28 NOTE — Addendum Note (Signed)
Addendum  created 12/28/16 2026 by Graciela HusbandsWynn O Tyrena Gohr, CRNA   Order list changed

## 2016-12-28 NOTE — Anesthesia Procedure Notes (Signed)
Spinal  Patient location during procedure: OR Start time: 12/28/2016 12:40 PM End time: 12/28/2016 12:44 PM Staffing Anesthesiologist: Nolon Nations Performed: anesthesiologist  Preanesthetic Checklist Completed: patient identified, site marked, surgical consent, pre-op evaluation, timeout performed, IV checked, risks and benefits discussed and monitors and equipment checked Spinal Block Patient position: sitting Prep: Betadine Patient monitoring: heart rate, continuous pulse ox and blood pressure Location: L3-4 Injection technique: single-shot Needle Needle type: Sprotte  Needle gauge: 24 G Needle length: 9 cm Additional Notes Expiration date of kit checked and confirmed. Patient tolerated procedure well, without complications.

## 2016-12-28 NOTE — Op Note (Signed)
PROCEDURE DATE: 12/28/2016  PREOPERATIVE DIAGNOSIS: Intrauterine pregnancy at  [redacted] weeks gestation  POSTOPERATIVE DIAGNOSIS: The same  PROCEDURE:   Repeat low transverse Cesarean Section  SURGEON: Dr. Scheryl DarterJames Arnold                     Dr.  Ernestina PennaNicholas Cloyce Bray  ASSISTANT: none  INDICATIONS: Sabrina Bray is a 31y/o  G4P3  at 39 wks scheduled for cesarean section secondary to elective repeat.  The risks of cesarean section discussed with the patient included but were not limited to: bleeding which may require transfusion or reoperation; infection which may require antibiotics; injury to bowel, bladder, ureters or other surrounding organs; injury to the fetus; need for additional procedures including hysterectomy in the event of a life-threatening hemorrhage; placental abnormalities wth subsequent pregnancies, incisional problems, thromboembolic phenomenon and other postoperative/anesthesia complications. The patient concurred with the proposed plan, giving informed written consent for the procedure.    FINDINGS:  Viable female infant in cephalic presentation.   Apgars . 8 and 9. Clear amniotic fluid.  Intact placenta, three vessel cord.  Normal uterus, fallopian tubes and ovaries bilaterally. Significant thickened and scarred fascia  ANESTHESIA:    Spinal ESTIMATED BLOOD LOSS: 700ml SPECIMENS: None COMPLICATIONS: None immediate   PROCEDURE IN DETAIL:  The patient received intravenous antibiotics and had sequential compression devices applied to her lower extremities while in the preoperative area.  She was then taken to the operating room where spinal anesthesia was administered and was found to be adequate. She was then placed in a dorsal supine position with a leftward tilt, and prepped and draped in a sterile manner.  A foley catheter was placed into her bladder and attached to constant gravity.  After an adequate timeout was performed, a Pfannenstiel skin incision was made with scalpel and  carried through to the underlying layer of fascia. The fascia was incised in the midline and this incision was extended bilaterally using the Mayo scissors. Kocher clamps were applied to the superior aspect of the fascial incision and the underlying rectus muscles were dissected off bluntly. A similar process was carried out on the inferior aspect of the facial incision. The rectus muscles were separated in the midline bluntly and the peritoneum was entered bluntly. The peritoneum was extended using mayo scissors. Attention was turned to the lower uterine segment where a bladder flap was created. The Alexis retractor was placed. A low  transverse hysterotomy was made with a scalpel and extended bilaterally bluntly.  The infant was successfully delivered, and cord was clamped and cut and infant was handed over to awaiting neonatology team. Uterine massage was then administered and the placenta delivered intact with three-vessel cord. The uterus was then exteriorized and cleared of clot and debris.  The hysterotomy was closed with 0 Vicryll in a running locked fashion, and an imbricating layer was also placed with a 0 Vicryl. Overall, excellent hemostasis was noted. The uterus was returned to the abdomen. Hemostasis was confirmed on all surfaces. The fascia was then closed using 0 Vicryl in a running locked fashion.  A The skin was closed with 4-0 Vicryl. The patient tolerated the procedure well. Sponge, lap, instrument and needle counts were correct x 2. She was taken to the recovery room in stable condition.    Sabrina PennaNicholas Kesia Dalto MD 12/28/2016 [id number]

## 2016-12-28 NOTE — Anesthesia Postprocedure Evaluation (Signed)
Anesthesia Post Note  Patient: Royce MacadamiaSantanna Horrigan  Procedure(s) Performed: Procedure(s) (LRB): REPEAT CESAREAN SECTION (Bilateral)  Patient location during evaluation: Mother Baby Anesthesia Type: Spinal Level of consciousness: awake and alert and oriented Pain management: satisfactory to patient Vital Signs Assessment: post-procedure vital signs reviewed and stable Respiratory status: respiratory function stable and spontaneous breathing Cardiovascular status: blood pressure returned to baseline Postop Assessment: no headache, no backache, spinal receding and patient able to bend at knees Anesthetic complications: no        Last Vitals:  Vitals:   12/28/16 1700 12/28/16 1815  BP: 136/82 131/82  Pulse: 62 66  Resp: 18 20  Temp: 36.5 C 36.6 C    Last Pain:  Vitals:   12/28/16 1815  TempSrc: Oral  PainSc: 0-No pain   Pain Goal: Patients Stated Pain Goal: 3 (12/28/16 1134)               Amelya Larch

## 2016-12-28 NOTE — Anesthesia Postprocedure Evaluation (Signed)
Anesthesia Post Note  Patient: Sabrina Bray  Procedure(s) Performed: Procedure(s) (LRB): REPEAT CESAREAN SECTION (Bilateral)  Patient location during evaluation: PACU Anesthesia Type: Spinal Level of consciousness: awake and alert Pain management: pain level controlled Vital Signs Assessment: post-procedure vital signs reviewed and stable Respiratory status: spontaneous breathing and respiratory function stable Cardiovascular status: blood pressure returned to baseline and stable Postop Assessment: spinal receding Anesthetic complications: no        Last Vitals:  Vitals:   12/28/16 1530 12/28/16 1535  BP: 127/73   Pulse: 67 69  Resp: 16 16  Temp: 36.7 C     Last Pain:  Vitals:   12/28/16 1530  TempSrc: Oral  PainSc: 5    Pain Goal: Patients Stated Pain Goal: 3 (12/28/16 1134)               Lewie LoronJohn Aemon Koeller

## 2016-12-28 NOTE — Lactation Note (Addendum)
This note was copied from a baby's chart. Lactation Consultation Note Initial visit at 1 hour of age, mom and baby in PACU.  Report from RN, baby has been jittery (glucose test pending), poor latch, and mom wants to exclusively breastfeed this child.   Mom has 2 older children that she combined fed with breast and bottles of formula for a few weeks each.  Mom unsure if she wants to exclusively breast feed or if she will give formula to this baby as she did with her other children.  Lc discussed at length reasons to not offer formula unless medically necessary.  Lc encouraged mom to try hand expressing, hand pump and spoon feeding prior to offering formula.  Mom reports needing to be patient with baby.   Mom has baby on chest STS with attempts to latch baby.  Lc asked for permission to assist with latching, mom agrees.  LC unable to hand express any drops of colostrum at this time, but was able to instruct mom on how.  Due to mom's recovery mom has not demonstrated hand expression herself. LC assisted minimally with sandwiching breast tissue for baby to latch in laid back hold.  Baby maintained feeding for about 4 minutes, then had lab draw while sts and then baby re-latched for an additional 4 minutes.  With stimulation baby maintained good rhythmic sucking with few audible swallows.  Mom was able to observe good jaw and ear movements.  When baby stopped sucking mom moved baby to chest and expressed wanting to leave baby alone now.  [facility] resources given and discussed.  Encouraged to feed with early cues on demand, cluster feeding discussed.  Early newborn behavior discussed.  Mom to call for assist as needed.   Addendum: noted glucose level of 89 during feeding.  Patient Name: Sabrina Bray ZOXWR'UToday's Date: 12/28/2016 Reason for consult: Initial assessment   Maternal Data Has patient been taught Hand Expression?: Yes Does the patient have breastfeeding experience prior to this delivery?:  Yes  Feeding Feeding Type: Breast Fed Length of feed: 8 min  LATCH Score/Interventions Latch: Repeated attempts needed to sustain latch, nipple held in mouth throughout feeding, stimulation needed to elicit sucking reflex. Intervention(s): Adjust position;Assist with latch;Breast massage;Breast compression  Audible Swallowing: A few with stimulation  Type of Nipple: Everted at rest and after stimulation (short shaft)  Comfort (Breast/Nipple): Soft / non-tender     Hold (Positioning): Assistance needed to correctly position infant at breast and maintain latch. Intervention(s): Breastfeeding basics reviewed;Support Pillows;Position options;Skin to skin  LATCH Score: 7  Lactation Tools Discussed/Used WIC Program: No (plans to apply)   Consult Status Consult Status: Follow-up Date: 12/29/16 Follow-up type: In-patient    Shoptaw, Arvella MerlesJana Lynn 12/28/2016, 3:29 PM

## 2016-12-28 NOTE — Transfer of Care (Signed)
Immediate Anesthesia Transfer of Care Note  Patient: Sabrina Bray  Procedure(s) Performed: Procedure(s): REPEAT CESAREAN SECTION (Bilateral)  Patient Location: PACU  Anesthesia Type:Spinal  Level of Consciousness: awake, alert  and oriented  Airway & Oxygen Therapy: Patient Spontanous Breathing  Post-op Assessment: Report given to RN and Post -op Vital signs reviewed and stable  Post vital signs: Reviewed and stable  Last Vitals:  Vitals:   12/28/16 1134  BP: 125/84  Pulse: 85  Resp: 20  Temp: 37 C    Last Pain:  Vitals:   12/28/16 1134  TempSrc: Oral  PainSc: 2       Patients Stated Pain Goal: 3 (12/28/16 1134)  Complications: No apparent anesthesia complications

## 2016-12-28 NOTE — H&P (Signed)
Obstetric Preoperative History and Physical  Sabrina Bray is a 31 y.o. [id number] with IUP at [redacted]w[redacted]d presenting for scheduled cesarean section.  No acute concerns.  Patient has history of 2 previous c-sections  Prenatal Course Source of Care: [facility]  with onset of care at 12 weeks Pregnancy complications or risks: Patient Active Problem List   Diagnosis Date Noted  . Positive urine drug screen 06/27/2016  . Encounter for supervision of normal pregnancy in multigravida 06/22/2016  . Previous cesarean section 06/22/2016   She plans to breastfeed She desires undecided for postpartum contraception.   Prenatal labs and studies: ABO, Rh: --/--/O POS (01/26 4540) Antibody: NEG (01/26 9811) Rubella: 2.61 (07/24 1205) RPR: Non Reactive (01/26 0953)  HBsAg: NEGATIVE (07/24 1205)  HIV: NONREACTIVE (12/11 1454)  GBS: negative 1 hr Glucola  normal Genetic screening abnormal with increased risk of T18 Anatomy US normal  Prenatal Transfer Tool  Maternal Diabetes: No Genetic Screening: Abnormal:  Results: Trisomy 18 increased risk Maternal Ultrasounds/Referrals: Normal Fetal Ultrasounds or other Referrals:  None Maternal Substance Abuse:  No Significant Maternal Medications:  None Significant Maternal Lab Results: None  Past Medical History:  Diagnosis Date  . Anemia    history  . Anxiety   . Environmental allergies   . Heart murmur    as child - no problems    Past Surgical History:  Procedure Laterality Date  . CESAREAN SECTION N/A 11/03/2011   due to [place] at 42 weeks [place]  . CESAREAN SECTION N/A 11/09/2013   Procedure: REPEAT CESAREAN SECTION;  Surgeon: Lesly Dukes, MD;  Location: [facility];  Service: Obstetrics;  Laterality: N/A;  . PILONIDAL CYST EXCISION N/A 02/2008, 2011   x 2 surgeries    OB History  Gravida Para Term Preterm AB Living  4 2 2   1 2   SAB TAB Ectopic Multiple Live Births  1       2    # Outcome Date GA Lbr Len/2nd Weight Sex Delivery Anes  PTL Lv  4 Current           3 Term 11/09/13 [redacted]w[redacted]d  6 lb 13.9 oz (3.115 kg) F CS-LTranv Spinal  LIV  2 Term 11/03/11 [redacted]w[redacted]d  7 lb 6 oz (3.345 kg) M CS-LTranv Spinal N LIV  1 SAB 04/2008              Social History   Social History  . Marital status: Single    Spouse name: N/A  . Number of children: N/A  . Years of education: N/A   Social History Main Topics  . Smoking status: Current Every Day Smoker    Packs/day: 0.50    Years: 11.00    Types: Cigarettes  . Smokeless tobacco: Never Used  . Alcohol use No  . Drug use: No  . Sexual activity: Yes    Birth control/ protection: None     Comment: pregnant   Other Topics Concern  . 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 Moved to [place] from [place], [place] in August 2014.     Family History  Problem Relation Age of Onset  . Hypertension Maternal Grandmother   . Cancer Maternal Grandmother     ovarian   . Hypertension Paternal Grandmother   . Hypertension Mother     Facility-Administered Medications Prior to Admission  Medication Dose Route Frequency Provider Last Rate Last Dose  . ceFAZolin (ANCEF)  2 g in dextrose 5 % 100 mL IVPB  2 g Intravenous Once Adam PhenixJames G Arnold, MD       Prescriptions Prior to Admission  Medication Sig Dispense Refill Last Dose  . Docosahexaenoic Acid (PRENATAL DHA PO) Take 1 tablet by mouth daily.   Taking    Allergies  Allergen Reactions  . Pollen Extract Other (See Comments)    Sneezing, runny eyes    Review of Systems: Negative except for what is mentioned in HPI.  Physical Exam: LMP 03/30/2016  FHR by Doppler: 140 bpm CONSTITUTIONAL: Well-developed, well-nourished female in no acute distress.  HENT:  Normocephalic, atraumatic, External right and left ear normal. Oropharynx is clear and moist EYES: Conjunctivae and EOM are normal. Pupils are equal, round, and reactive to light. No scleral icterus.  NECK: Normal range of motion, supple, no  masses SKIN: Skin is warm and dry. No rash noted. Not diaphoretic. No erythema. No pallor. NEUROLGIC: Alert and oriented to person, place, and time. Normal reflexes, muscle tone coordination. No cranial nerve deficit noted. PSYCHIATRIC: Normal mood and affect. Normal behavior. Normal judgment and thought content. CARDIOVASCULAR: Normal heart rate noted, regular rhythm RESPIRATORY: Effort and breath sounds normal, no problems with respiration noted ABDOMEN: Soft, nontender, nondistended, gravid. Keloid Pfannenstiel incision. PELVIC: Deferred MUSCULOSKELETAL: Normal range of motion. No edema and no tenderness. 2+ distal pulses.   Pertinent Labs/Studies:   No results found for this or any previous visit (from the past 72 hour(s)).  Assessment and Plan :Sabrina Bray is a 31 y.o. Z6X0960G4P2012 at 3984w0d being admitted for scheduled cesarean section. The risks of cesarean section discussed with the patient included but were not limited to: bleeding which may require transfusion or reoperation; infection which may require antibiotics; injury to bowel, bladder, ureters or other surrounding organs; injury to the fetus; need for additional procedures including hysterectomy in the event of a life-threatening hemorrhage; placental abnormalities wth subsequent pregnancies, incisional problems, thromboembolic phenomenon and other postoperative/anesthesia complications. The patient concurred with the proposed plan, giving informed written consent for the procedure. Patient has been NPO since last night she will remain NPO for procedure. Anesthesia and OR aware. Preoperative prophylactic antibiotics and SCDs ordered on call to the OR. To OR when ready.    Ernestina PennaNicholas Fatiha Guzy MD OB fellow Faculty Practice, [facility]

## 2016-12-28 NOTE — Anesthesia Preprocedure Evaluation (Signed)
Anesthesia Evaluation  Patient identified by MRN, date of birth, ID band Patient awake    Reviewed: Allergy & Precautions, H&P , NPO status , Patient's Chart, lab work & pertinent test results  Airway Mallampati: II  TM Distance: >3 FB Neck ROM: full    Dental no notable dental hx. (+) Teeth Intact   Pulmonary neg pulmonary ROS, Current Smoker,    Pulmonary exam normal        Cardiovascular negative cardio ROS Normal cardiovascular exam     Neuro/Psych negative neurological ROS  negative psych ROS   GI/Hepatic negative GI ROS, Neg liver ROS,   Endo/Other  negative endocrine ROS  Renal/GU negative Renal ROS     Musculoskeletal negative musculoskeletal ROS (+)   Abdominal Normal abdominal exam  (+)   Peds  Hematology  (+) anemia ,   Anesthesia Other Findings   Reproductive/Obstetrics (+) Pregnancy                             Anesthesia Physical  Anesthesia Plan  ASA: II  Anesthesia Plan: Spinal   Post-op Pain Management:    Induction:   Airway Management Planned:   Additional Equipment:   Intra-op Plan:   Post-operative Plan:   Informed Consent: I have reviewed the patients History and Physical, chart, labs and discussed the procedure including the risks, benefits and alternatives for the proposed anesthesia with the patient or authorized representative who has indicated his/her understanding and acceptance.   Dental advisory given  Plan Discussed with: CRNA  Anesthesia Plan Comments:         Anesthesia Quick Evaluation

## 2016-12-28 NOTE — Addendum Note (Signed)
Addendum  created 12/28/16 2036 by Graciela HusbandsWynn O Elizabeth Paulsen, CRNA   Sign clinical note

## 2016-12-29 LAB — CBC
HCT: 26.1 % — ABNORMAL LOW (ref 36.0–46.0)
HEMATOCRIT: 25.9 % — AB (ref 36.0–46.0)
Hemoglobin: 8.9 g/dL — ABNORMAL LOW (ref 12.0–15.0)
Hemoglobin: 8.9 g/dL — ABNORMAL LOW (ref 12.0–15.0)
MCH: 29 pg (ref 26.0–34.0)
MCH: 28.9 pg (ref 26.0–34.0)
MCHC: 34.4 g/dL (ref 30.0–36.0)
MCHC: 34.1 g/dL (ref 30.0–36.0)
MCV: 84.4 fL (ref 78.0–100.0)
MCV: 84.7 fL (ref 78.0–100.0)
PLATELETS: 156 10*3/uL (ref 150–400)
PLATELETS: 169 10*3/uL (ref 150–400)
RBC: 3.07 MIL/uL — AB (ref 3.87–5.11)
RBC: 3.08 MIL/uL — ABNORMAL LOW (ref 3.87–5.11)
RDW: 13.7 % (ref 11.5–15.5)
RDW: 13.7 % (ref 11.5–15.5)
WBC: 10.2 10*3/uL (ref 4.0–10.5)
WBC: 8.5 10*3/uL (ref 4.0–10.5)
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
## 2016-12-29 NOTE — Progress Notes (Addendum)
POSTPARTUM PROGRESS NOTE  Post Op Day #1 Subjective:  Sabrina Bray is a 31 y.o. Z6X0960G4P3013 288w0d s/p RLTCS.  No acute events overnight.  Pt denies problems with ambulating, voiding or po intake.  She denies nausea or vomiting.  Pain is well controlled.  She has had flatus. She has had bowel movement.  Lochia Minimal.   Objective: Blood pressure (!) 128/57, pulse 72, temperature 98.4 F (36.9 C), temperature source Oral, resp. rate 20, height 5\' 2"  (1.575 m), weight 84.8 kg (187 lb), last menstrual period 03/30/2016, SpO2 100 %, unknown if currently breastfeeding.  Physical Exam:  General: alert, cooperative and no distress Lochia:normal flow Chest: CTAB Heart: RRR no m/r/g Abdomen: +BS, soft, nontender,  Uterine Fundus: firm DVT Evaluation: No calf swelling or tenderness Extremities: no edema   Recent Labs  12/29/16 0537  HGB 8.9*  HCT 25.9*    Assessment/Plan:  ASSESSMENT: Sabrina Bray is a 31 y.o. [id number] 2188w0d s/p RLTCS.  Required 2U PRBC. Post transfusion H/H was 8.9 from 10.3.  Will repeat H/H for noon.  Will continue to monitor UOP.  Will pull foley if H/H is stable.   Plan for discharge tomorrow   LOS: 1 day   Renne Muscaaniel L Warden, MD PGY-1 [name]  12/29/2016, 9:20 AM   OB FELLOW DISCHARGE ATTESTATION  I have seen and examined this patient and agree with above documentation in the resident's note.   Ernestina PennaNicholas Puja Caffey, MD 9:05 AM

## 2016-12-29 NOTE — Lactation Note (Signed)
This note was copied from a baby's chart. Lactation Consultation Note  Mom is currently formula feeding because she does not think she has any milk. Her feeding goals are to combination feed for about 4 weeks so that her baby gets the antibodies. Reviewed hand expression and it was difficult to express any colostrum. Encouraged mother to always breast feed first and then offer colostrum. Explained that putting baby to the breast will bring the milk to volume sooner. Follow-up if needed.  Patient Name: Sabrina Bray [id number]'BToday's Date: 12/29/2016 Reason for consult: Follow-up assessment   Maternal Data    Feeding Feeding Type: Breast Fed Length of feed: 0 min  LATCH Score/Interventions Latch: Too sleepy or reluctant, no latch achieved, no sucking elicited.  Audible Swallowing: None  Type of Nipple: Everted at rest and after stimulation  Comfort (Breast/Nipple): Soft / non-tender     Hold (Positioning): No assistance needed to correctly position infant at breast.  LATCH Score: 6  Lactation Tools Discussed/Used     Consult Status Consult Status: PRN    Soyla DryerJoseph, [name] 12/29/2016, 12:28 PM

## 2016-12-30 MED ORDER — IBUPROFEN 600 MG PO TABS: 600.0000 mg | ORAL_TABLET | Freq: Four times a day (QID) | ORAL | 0 refills | Status: DC

## 2016-12-30 MED ORDER — OXYCODONE HCL 5 MG PO TABS: 5.0000 mg | ORAL_TABLET | ORAL | 0 refills | Status: DC | PRN

## 2016-12-30 NOTE — Discharge Instructions (Signed)

## 2016-12-30 NOTE — Progress Notes (Signed)
Discharge education complete, Prescription given, discharge instructions and follow up appointment discussed. Patient verbalized understanding.

## 2016-12-30 NOTE — Discharge Summary (Signed)
OB Discharge Summary     Patient Name: Sabrina Bray DOB: 05-Aug-1986 MRN: [id number]  Date of admission: 12/28/2016 Delivering MD: Adam PhenixARNOLD, JAMES G   Date of discharge: 12/30/2016  Admitting diagnosis: cpt [phone] - Previous cesarean section x2 Intrauterine pregnancy: 3347w0d     Secondary diagnosis:  Active Problems:   Previous cesarean section   History of 2 cesarean sections  Additional problems: None     Discharge diagnosis: Term Pregnancy Delivered and Prior Cesarean section x 2                                                                                                Post partum procedures:none  Augmentation: none  Complications: None  Hospital course:  Sceduled C/S   31 y.o. yo [id number] at 7147w0d was admitted to the hospital 12/28/2016 for scheduled cesarean section with the following indication:Elective Repeat.  Membrane Rupture Time/Date: 1:16 PM ,12/28/2016   Patient delivered a Viable infant.12/28/2016  Details of operation can be found in separate operative note.  Pateint had an uncomplicated postpartum course.  She is ambulating, tolerating a regular diet, passing flatus, and urinating well. Patient is discharged home in stable condition on  12/30/16         Physical exam  Vitals:   12/29/16 0836 12/29/16 0837 12/29/16 1322 12/29/16 1838  BP: (!) 128/57  120/71 130/70  Pulse: 72  76 77  Resp:  20  18  Temp:  98.4 F (36.9 C) 98.3 F (36.8 C) 99.4 F (37.4 C)  TempSrc:  Oral Oral Oral  SpO2: 100%  99%   Weight:      Height:       General: alert, cooperative and no distress Lochia: appropriate Uterine Fundus: firm Incision: Healing well with no significant drainage DVT Evaluation: No evidence of DVT seen on physical exam. Labs: Lab Results  Component Value Date   WBC 8.5 12/29/2016   HGB 8.9 (L) 12/29/2016   HCT 26.1 (L) 12/29/2016   MCV 84.7 12/29/2016   PLT 169 12/29/2016   CMP Latest Ref Rng & Units 09/22/2013  Glucose 70 - 99 mg/dL 76  BUN 6  - 23 mg/dL 6  Creatinine 9.560.50 - 2.131.10 mg/dL 0.86(V0.48(L)  Sodium 784135 - 696145 mEq/L 134(L)  Potassium 3.5 - 5.1 mEq/L 3.8  Chloride 96 - 112 mEq/L 103  CO2 19 - 32 mEq/L 23  Calcium 8.4 - 10.5 mg/dL 8.9  Total Protein 6.0 - 8.3 g/dL 6.9  Total Bilirubin 0.3 - 1.2 mg/dL 2.9(B0.2(L)  Alkaline Phos 39 - 117 U/L 79  AST 0 - 37 U/L 15  ALT 0 - 35 U/L 16    Discharge instruction: per After Visit Summary and "Baby and Me Booklet".  After visit meds:  Allergies as of 12/30/2016      Reactions   Pollen Extract Other (See Comments)   Sneezing, runny eyes      Medication List    TAKE these medications   ibuprofen 600 MG tablet Commonly known as:  ADVIL,MOTRIN Take 1 tablet (600 mg total) by mouth every 6 (six) hours.  oxyCODONE 5 MG immediate release tablet Commonly known as:  Oxy IR/ROXICODONE Take 1 tablet (5 mg total) by mouth every 4 (four) hours as needed (pain scale 4-7).   PRENATAL DHA PO Take 1 tablet by mouth daily.       Diet: routine diet  Activity: Advance as tolerated. Pelvic rest for 6 weeks.   Outpatient follow up:6 weeks Follow up Appt:No future appointments. Follow up Visit:No Follow-up on file.  Postpartum contraception: Undecided  Newborn Data: Live born female  Birth Weight: 6 lb 6.3 oz (2900 g) APGAR: 8, 9  Baby Feeding: Breast Disposition:home with mother   12/30/2016 Wynelle Bourgeois, CNM

## 2017-01-19 ENCOUNTER — Encounter (HOSPITAL_COMMUNITY): Payer: Self-pay | Admitting: Emergency Medicine

## 2017-01-19 ENCOUNTER — Emergency Department (HOSPITAL_COMMUNITY)
Admission: EM | Admit: 2017-01-19 | Discharge: 2017-01-19 | Disposition: A | Discharge status: Home / Self Care | Payer: Medicaid Other | Attending: Emergency Medicine | Admitting: Emergency Medicine

## 2017-01-19 DIAGNOSIS — Z79899 Other long term (current) drug therapy: Secondary | ICD-10-CM | POA: Insufficient documentation

## 2017-01-19 DIAGNOSIS — M533 Sacrococcygeal disorders, not elsewhere classified: Secondary | ICD-10-CM

## 2017-01-19 DIAGNOSIS — L0501 Pilonidal cyst with abscess: Secondary | ICD-10-CM | POA: Diagnosis not present

## 2017-01-19 DIAGNOSIS — F17210 Nicotine dependence, cigarettes, uncomplicated: Secondary | ICD-10-CM | POA: Insufficient documentation

## 2017-01-19 MED ORDER — CLINDAMYCIN HCL 300 MG PO CAPS
300.0000 mg | ORAL_CAPSULE | Freq: Once | ORAL | Status: AC
  Administered 2017-01-19: 300 mg via ORAL
  Filled 2017-01-19: qty 1

## 2017-01-19 MED ORDER — CLINDAMYCIN HCL 300 MG PO CAPS: 300.0000 mg | ORAL_CAPSULE | Freq: Four times a day (QID) | ORAL | 0 refills | Status: DC

## 2017-01-19 MED ORDER — HYDROCODONE-ACETAMINOPHEN 5-325 MG PO TABS: 1.0000 | ORAL_TABLET | Freq: Four times a day (QID) | ORAL | 0 refills | Status: DC | PRN

## 2017-01-19 MED ORDER — NAPROXEN 500 MG PO TABS: 500.0000 mg | ORAL_TABLET | Freq: Two times a day (BID) | ORAL | 0 refills | Status: DC | PRN

## 2017-01-19 MED ORDER — HYDROCODONE-ACETAMINOPHEN 5-325 MG PO TABS
1.0000 | ORAL_TABLET | Freq: Once | ORAL | Status: AC
  Administered 2017-01-19: 1 via ORAL
  Filled 2017-01-19: qty 1

## 2017-01-19 NOTE — ED Provider Notes (Signed)
[facility]-EMERGENCY DEPT Provider Note   CSN: [id number] Arrival date & time: 01/19/17  2058     History   Chief Complaint Chief Complaint  Patient presents with  . Tailbone Pain    HPI Sabrina Bray is a 31 y.o. female with a PMHx of pilonidal cysts s/p excision x2, and anemia, who presents to the ED with complaints of tailbone pain and "pilonidal cyst flareup" began 4 days ago. Patient describes the pain is 9/10 constant stinging nonradiating tailbone pain, worse with sitting down and palpation of the area, unrelieved with BC powders, and mildly improved with heat. She reports associated swelling to the area. She states this is consistent with her prior pilonidal cyst infections. She is not currently breast-feeding. She denies any erythema, warmth, drainage, or rectal pain. She also denies any fevers, chills, CP, SOB, abd pain, N/V/D/C, hematuria, dysuria, incontinence of urine/stool, saddle anesthesia/cauda equina symptoms, myalgias, arthralgias, numbness, tingling, focal weakness, or any other complaints at this time. NKDA.   The history is provided by the patient and medical records. No language interpreter was used.    Past Medical History:  Diagnosis Date  . Anemia   . Anxiety   . Environmental allergies   . Heart murmur    as child - no problems    Patient Active Problem List   Diagnosis Date Noted  . History of 2 cesarean sections 12/30/2016  . Positive urine drug screen 06/27/2016  . Encounter for supervision of normal pregnancy in multigravida 06/22/2016  . Previous cesarean section 06/22/2016    Past Surgical History:  Procedure Laterality Date  . CESAREAN SECTION N/A 11/03/2011   due to failed IOL at 42 weeks - [place]  . CESAREAN SECTION N/A 11/09/2013   Procedure: REPEAT CESAREAN SECTION;  Surgeon: Lesly Dukes, MD;  Location: [facility];  Service: Obstetrics;  Laterality: N/A;  . CESAREAN SECTION Bilateral 12/28/2016   Procedure: REPEAT CESAREAN SECTION;   Surgeon: Adam Phenix, MD;  Location: [facility];  Service: Obstetrics;  Laterality: Bilateral;  . PILONIDAL CYST EXCISION N/A 02/2008, 2011   x 2 surgeries    OB History    Gravida Para Term Preterm AB Living   4 3 3  0 1 3   SAB TAB Ectopic Multiple Live Births   1 0 0 0 3       Home Medications    Prior to Admission medications   Medication Sig Start Date End Date Taking? Authorizing Provider  Docosahexaenoic Acid (PRENATAL DHA PO) Take 1 tablet by mouth daily.    Historical Provider, MD  ibuprofen (ADVIL,MOTRIN) 600 MG tablet Take 1 tablet (600 mg total) by mouth every 6 (six) hours. 12/30/16   Aviva Signs, CNM  oxyCODONE (OXY IR/ROXICODONE) 5 MG immediate release tablet Take 1 tablet (5 mg total) by mouth every 4 (four) hours as needed (pain scale 4-7). 12/30/16   Aviva Signs, CNM    Family History Family History  Problem Relation Age of Onset  . Hypertension Maternal Grandmother   . Cancer Maternal Grandmother     ovarian   . Hypertension Paternal Grandmother   . Hypertension Mother     Social History Social History  Substance Use Topics  . Smoking status: Current Every Day Smoker    Packs/day: 0.50    Years: 11.00    Types: Cigarettes  . Smokeless tobacco: Never Used  . Alcohol use No     Allergies   Pollen extract   Review of  Systems Review of Systems  Constitutional: Negative for chills and fever.  Respiratory: Negative for shortness of breath.   Cardiovascular: Negative for chest pain.  Gastrointestinal: Negative for abdominal pain, constipation, diarrhea, nausea, rectal pain and vomiting.  Genitourinary: Negative for difficulty urinating (no incontinence), dysuria and hematuria.  Musculoskeletal: Positive for back pain (tailbone). Negative for arthralgias and myalgias.  Skin: Positive for wound (pilonidal cyst). Negative for color change.  Allergic/Immunologic: Negative for immunocompromised state.  Neurological: Negative for  weakness and numbness.  Psychiatric/Behavioral: Negative for confusion.   10 Systems reviewed and are negative for acute change except as noted in the HPI.   Physical Exam Updated Vital Signs BP 168/87 (BP Location: Right Arm)   Pulse 84   Temp 98.8 F (37.1 C) (Oral)   Resp 18   Ht 5\' 2"  (1.575 m)   Wt 68 kg   SpO2 100%   BMI 27.44 kg/m   Physical Exam  Constitutional: She is oriented to person, place, and time. Vital signs are normal. She appears well-developed and well-nourished.  Non-toxic appearance. No distress.  Afebrile, nontoxic, NAD  HENT:  Head: Normocephalic and atraumatic.  Mouth/Throat: Mucous membranes are normal.  Eyes: Conjunctivae and EOM are normal. Right eye exhibits no discharge. Left eye exhibits no discharge.  Neck: Normal range of motion. Neck supple.  Cardiovascular: Normal rate and intact distal pulses.   Pulmonary/Chest: Effort normal. No respiratory distress.  Abdominal: Normal appearance. She exhibits no distension.  Musculoskeletal: Normal range of motion.  Neurological: She is alert and oriented to person, place, and time. She has normal strength. No sensory deficit.  Skin: Skin is warm, dry and intact. Lesion noted. No rash noted.  Chaperone present. +Pilonidal cyst to end of coccyx, mildly indurated, measuring ~61mm in diameter, without fluctuance, with mild scant bloody drainage from a sinus tract present tracking out to the skin, no extension of cyst towards the rectum or gluteal region, no surrounding erythema or warmth, moderately TTP.   Psychiatric: She has a normal mood and affect. Her behavior is normal.  Nursing note and vitals reviewed.    ED Treatments / Results  Labs (all labs ordered are listed, but only abnormal results are displayed) Labs Reviewed - No data to display  EKG  EKG Interpretation None       Radiology No results found.  Procedures Procedures (including critical care time)  Medications Ordered in  ED Medications  HYDROcodone-acetaminophen (NORCO/VICODIN) 5-325 MG per tablet 1 tablet (not administered)  clindamycin (CLEOCIN) capsule 300 mg (not administered)     Initial Impression / Assessment and Plan / ED Course  I have reviewed the triage vital signs and the nursing notes.  Pertinent labs & imaging results that were available during my care of the patient were reviewed by me and considered in my medical decision making (see chart for details).     31 y.o. female here with infected pilonidal cyst x4 days. Has had multiple excisions in the past. On exam, indurated pilonidal cyst, no fluctuance or extension down towards rectum, mild bloody drainage, no surrounding erythema or warmth. Will start on clindamycin, no indication for I&D today, advised pt to f/up with her surgeon for ongoing management of her recurrent pilonidal cysts. Pain meds given. [place] reviewed prior to dispensing controlled substance medications, and was notable for: 30tabs oxycodone 5mg  on 12/30/16, no other controlled substances found. Risks/benefits/alternatives and expectations discussed regarding controlled substances. Side effects of medications discussed. Informed consent obtained. F/up with PCP or  her surgeon in 2-3 days for recheck and ongoing management. Advised heat compresses and sitz baths. I explained the diagnosis and have given explicit precautions to return to the ER including for any other new or worsening symptoms. The patient understands and accepts the medical plan as it's been dictated and I have answered their questions. Discharge instructions concerning home care and prescriptions have been given. The patient is STABLE and is discharged to home in good condition.   Final Clinical Impressions(s) / ED Diagnoses   Final diagnoses:  Pilonidal cyst with abscess  Coccydynia    New Prescriptions New Prescriptions   CLINDAMYCIN (CLEOCIN) 300 MG CAPSULE    Take 1 capsule (300 mg total) by mouth  4 (four) times daily. X 7 days   HYDROCODONE-ACETAMINOPHEN (NORCO) 5-325 MG TABLET    Take 1 tablet by mouth every 6 (six) hours as needed for severe pain.   NAPROXEN (NAPROSYN) 500 MG TABLET    Take 1 tablet (500 mg total) by mouth 2 (two) times daily as needed for mild pain, moderate pain or headache (TAKE WITH MEALS.).     [address]Jaiona Simien, PA-C 01/19/17 2303    Rolland PorterMark James, MD 01/20/17 [phone]

## 2017-01-19 NOTE — ED Triage Notes (Signed)
Patient reports tailbone pain x4 days. Hx pilonidal cyst. Denies fevers, abdominal pain and N/V/D.

## 2017-01-19 NOTE — Discharge Instructions (Signed)
Keep wound clean and dry. Apply warm compresses to affected area throughout the day, and perform warm sitz baths at least twice daily. Take antibiotic until it is finished. Take naprosyn and norco as directed, as needed for pain but do not drive or operate machinery with pain medication use. Followup with your surgeon or your Primary Care doctor in 2-3 days for wound recheck and for ongoing management of your pilonidal cyst. Monitor area for signs of infection to include, but not limited to: increasing pain, spreading redness, drainage/pus, worsening swelling, or fevers. Return to emergency department for emergent changing or worsening symptoms.

## 2017-01-26 ENCOUNTER — Ambulatory Visit: Payer: Medicaid Other | Admitting: Medical

## 2017-09-14 IMAGING — US US OB COMP LESS 14 WK
1 series · 15 of 28 positions shown · non-contrast
Comparison: None.

CLINICAL DATA: Vaginal bleeding.

EXAM:
OBSTETRIC <14 WK US AND TRANSVAGINAL OB US
TECHNIQUE: Both transabdominal and transvaginal ultrasound examinations were
performed for complete evaluation of the gestation as well as the
maternal uterus, adnexal regions, and pelvic cul-de-sac.
Transvaginal technique was performed to assess early pregnancy.

[Series 1: us ob comp less 14 wk · 15 of 55 slices shown]
[im 1/55]
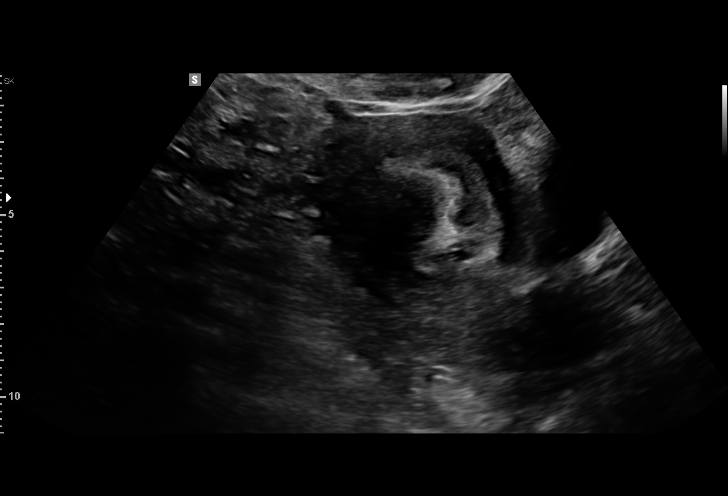
[im 5/55]
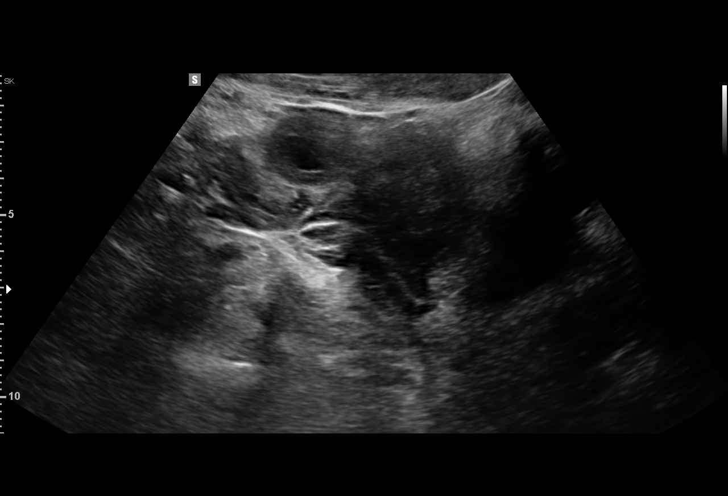
[im 9/55]
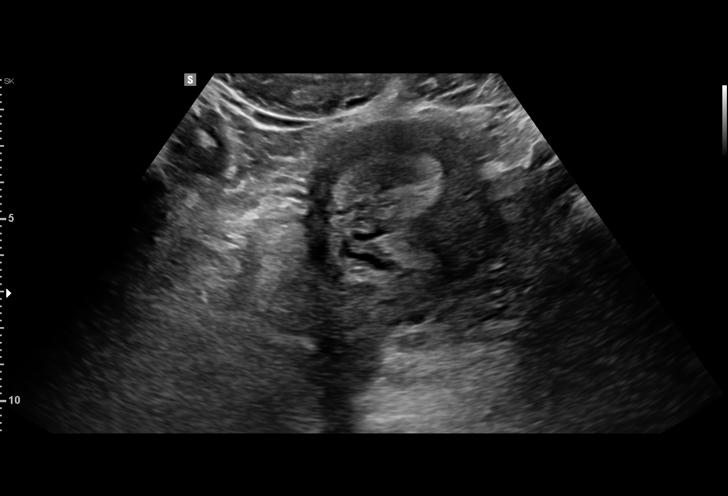
[im 13/55]
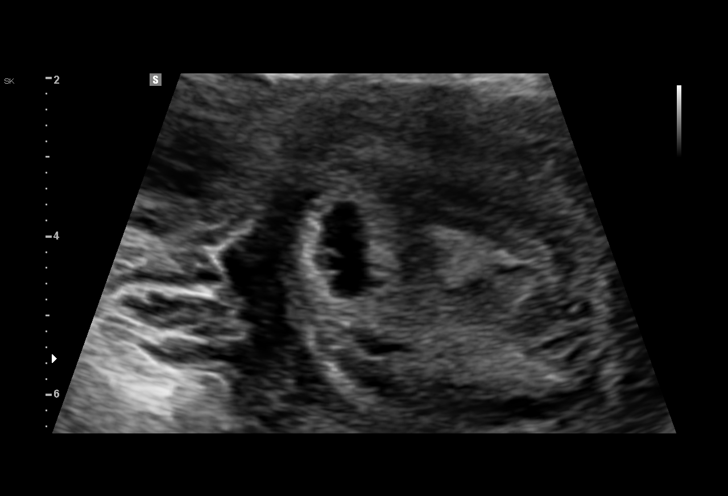
[im 17/55]
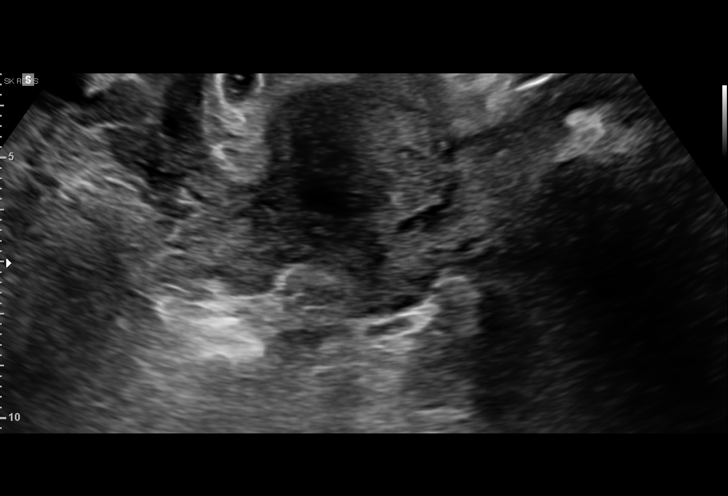
[im 21/55]
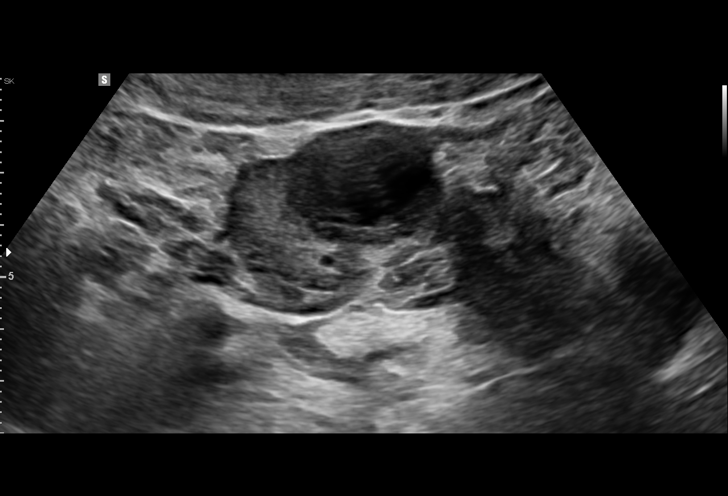
[im 25/55]
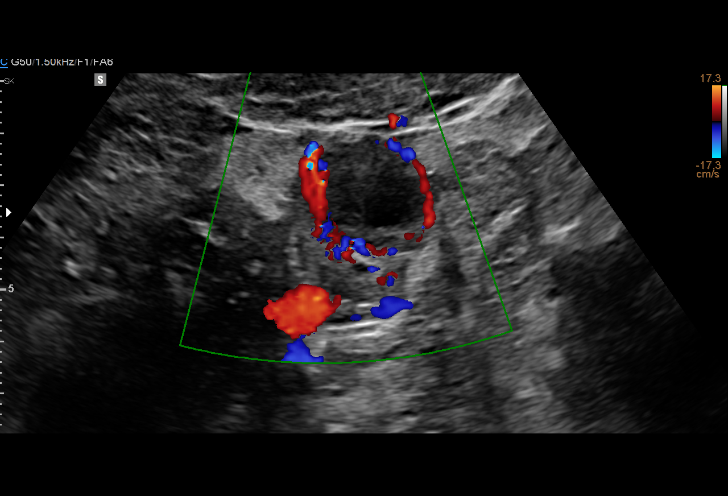
[im 29/55]
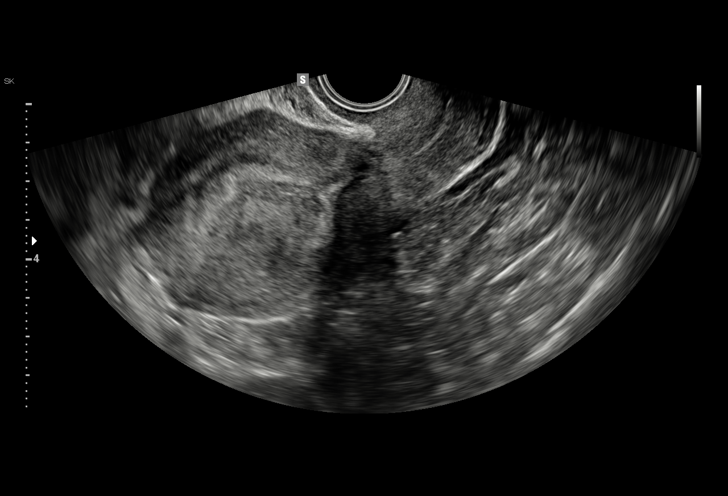
[im 31/55]
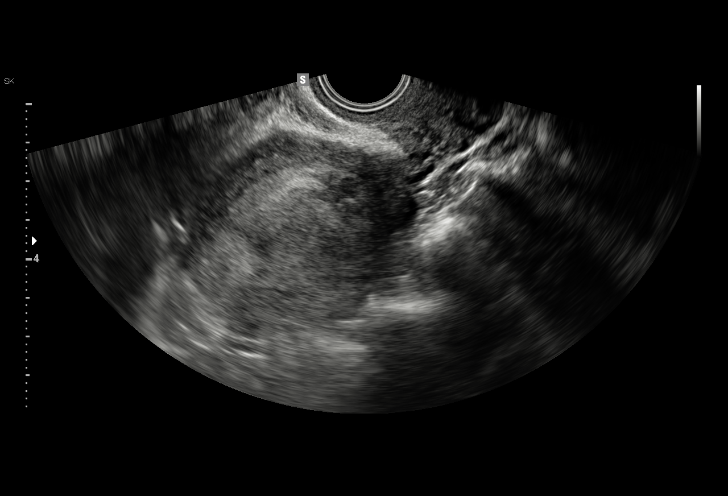
[im 35/55]
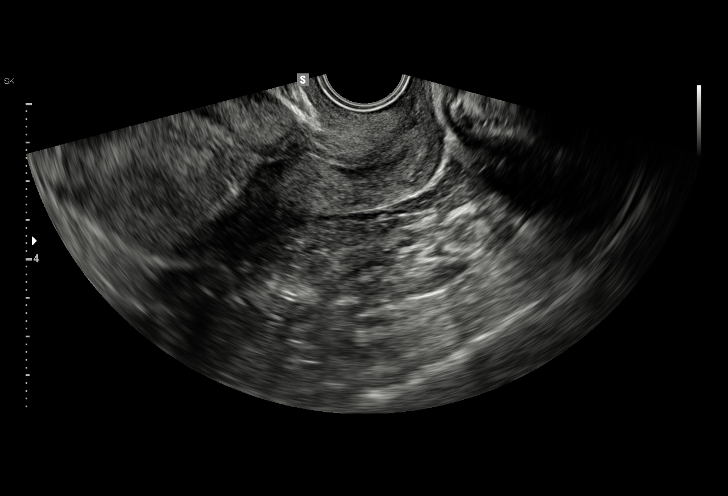
[im 39/55]
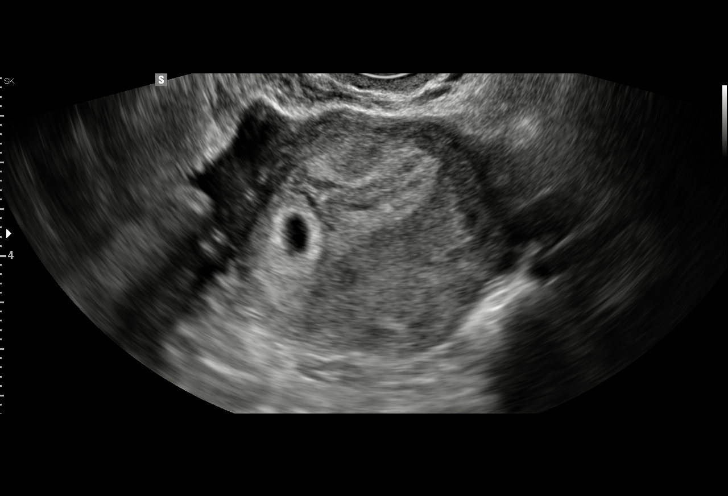
[im 43/55]
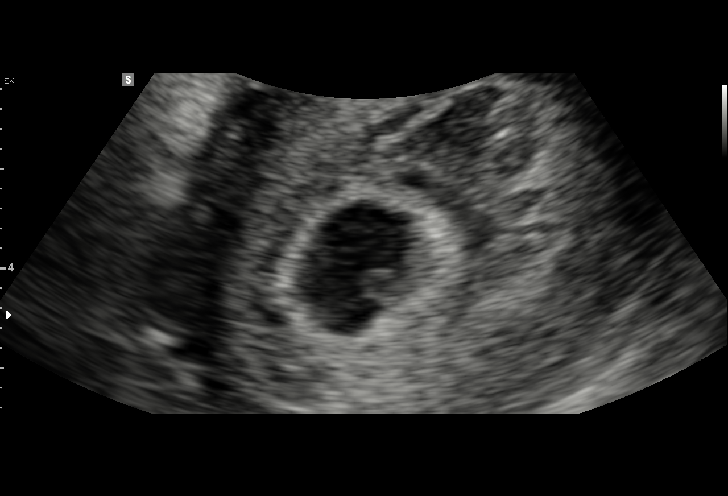
[im 47/55]
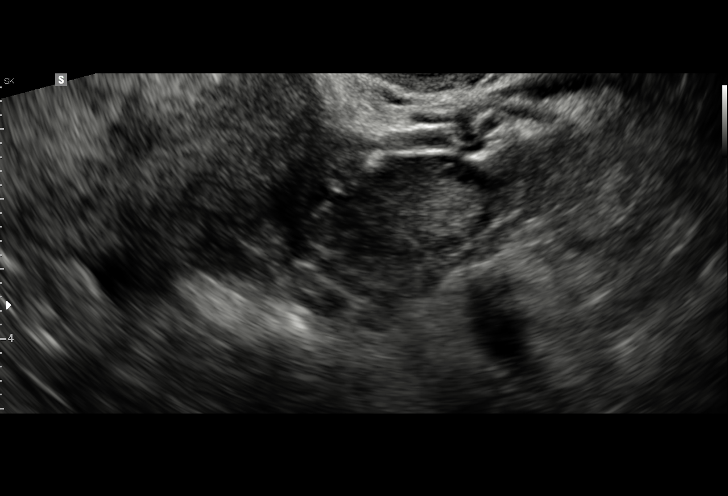
[im 51/55]
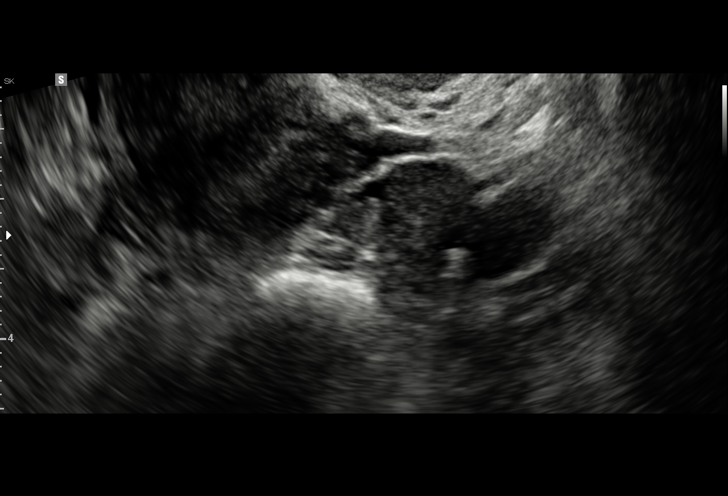
[im 55/55]
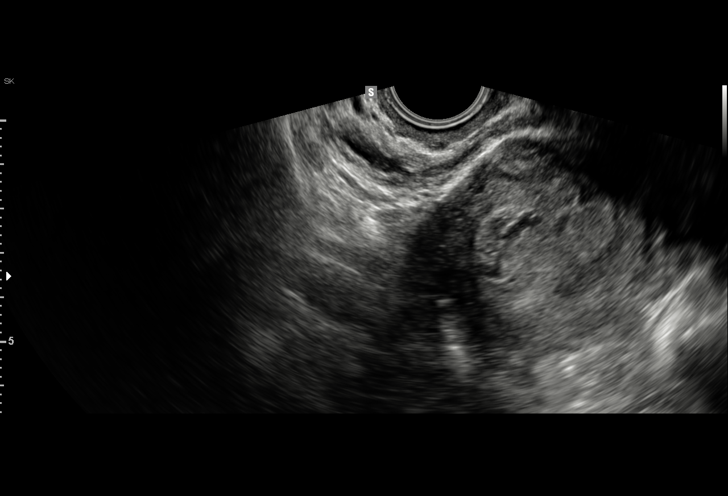

[15 of 28 positions shown; findings below may reference images not displayed]

FINDINGS: Intrauterine gestational sac: Single

Yolk sac:  Present

Embryo:  Not present

Cardiac Activity: Not present

MSD: 10.5  mm   5 w   6  d

Subchorionic hemorrhage:  Small subchorionic hemorrhage.

Maternal uterus/adnexae: No pelvic free fluid. 2.1 x 1.9 x 2.2 cm
hypoechoic left ovarian mass with peripheral Doppler flow most
consistent with a corpus luteum cyst. Otherwise normal ovaries.
IMPRESSION: Probable early intrauterine gestational sac and yolk sac, but no
fetal pole or cardiac activity yet visualized. Recommend follow-up
quantitative B-HCG levels and follow-up US in 14 days to confirm and
assess viability. This recommendation follows SRU consensus
guidelines: Diagnostic Criteria for Nonviable Pregnancy Early in the
First Trimester. N Engl J Med 9538; [DATE].

Small subchorionic hemorrhage.

## 2017-12-15 IMAGING — US US MFM OB DETAIL+14 WK
1 series · 14 of 28 positions shown · non-contrast
Comparison: none

[Series 1: us mfm ob detail+14 wk · 14 of 89 slices shown]
[im 4/89]
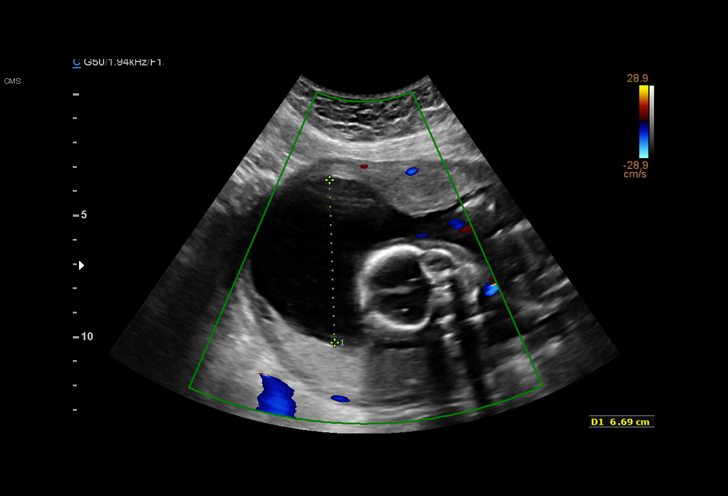
[im 10/89]
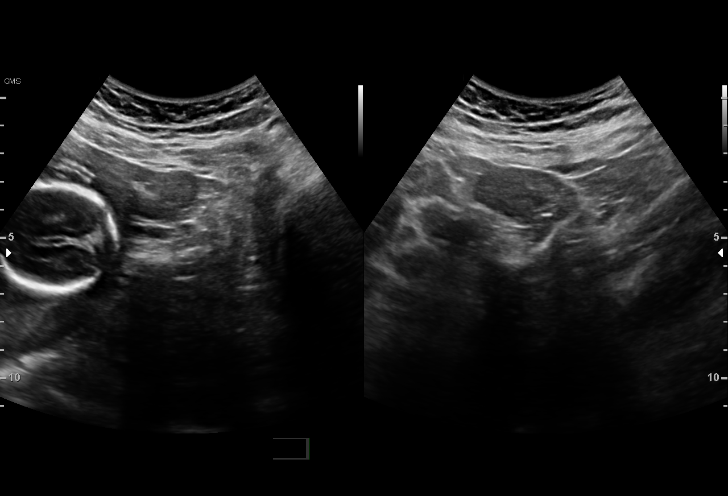
[im 17/89]
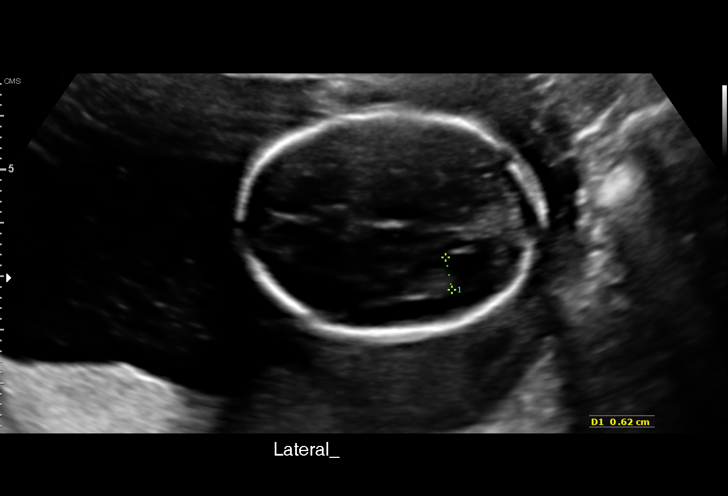
[im 23/89]
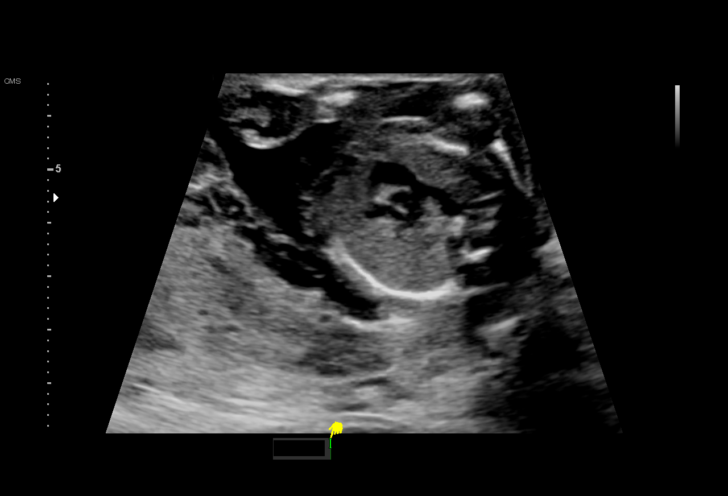
[im 30/89]
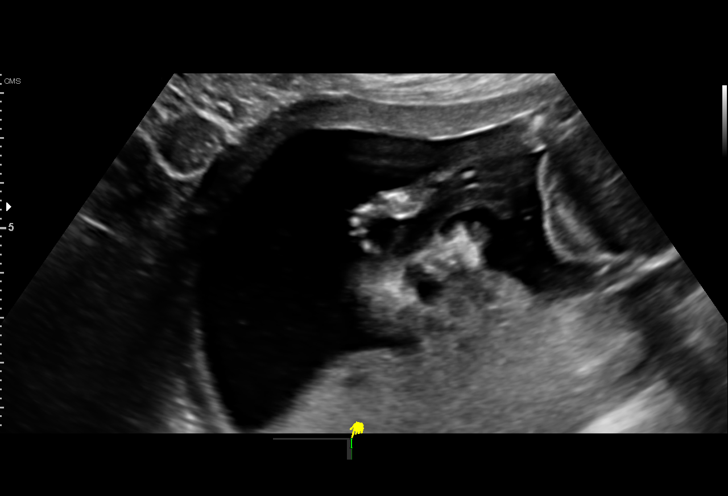
[im 36/89]
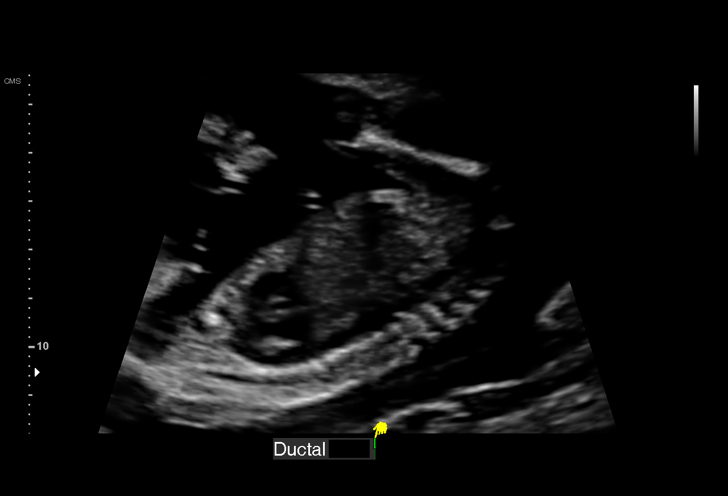
[im 43/89]
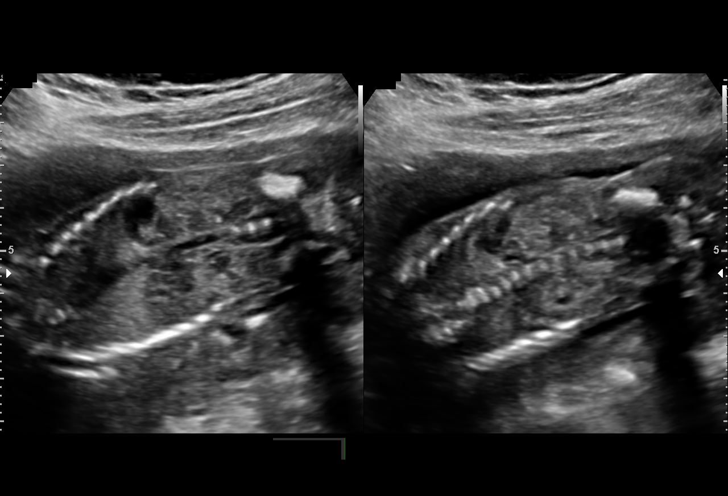
[im 49/89]
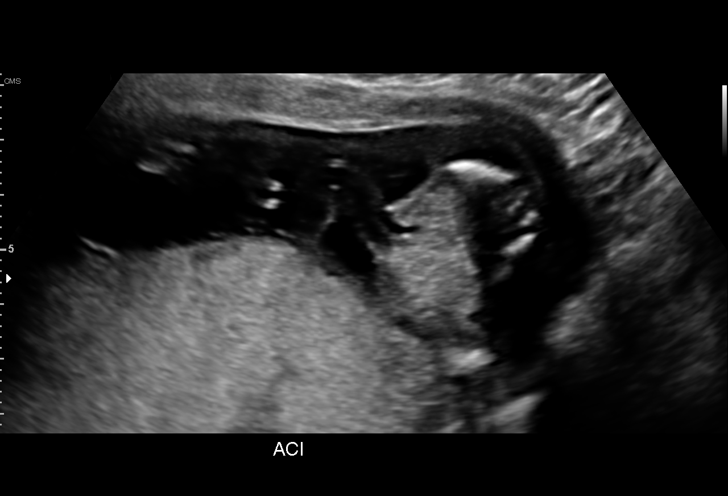
[im 56/89]
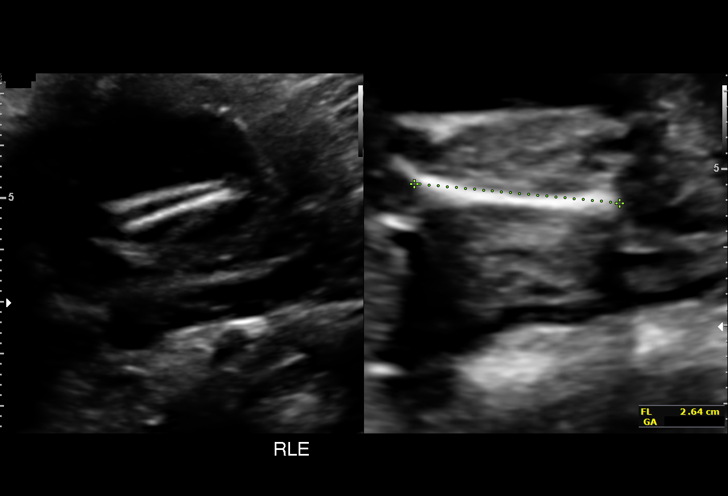
[im 62/89]
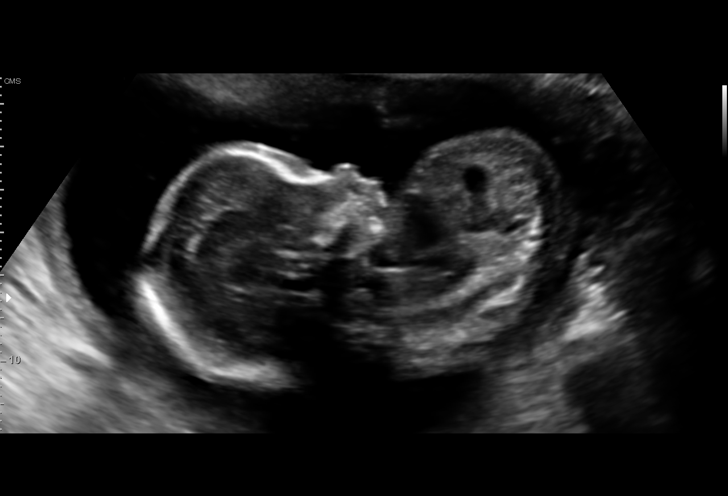
[im 69/89]
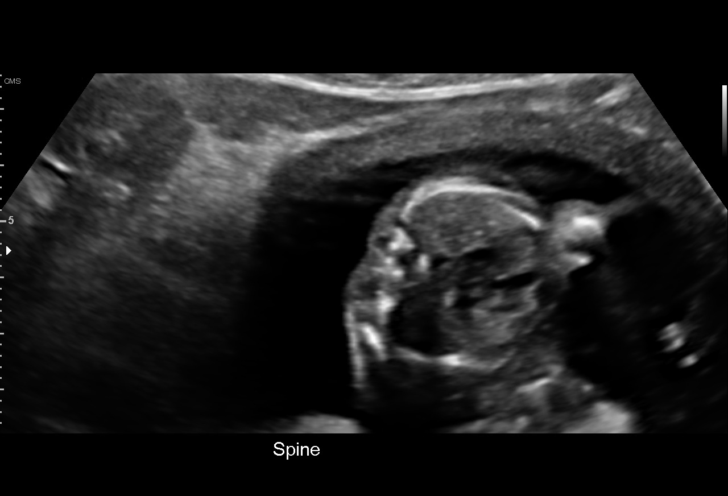
[im 75/89]
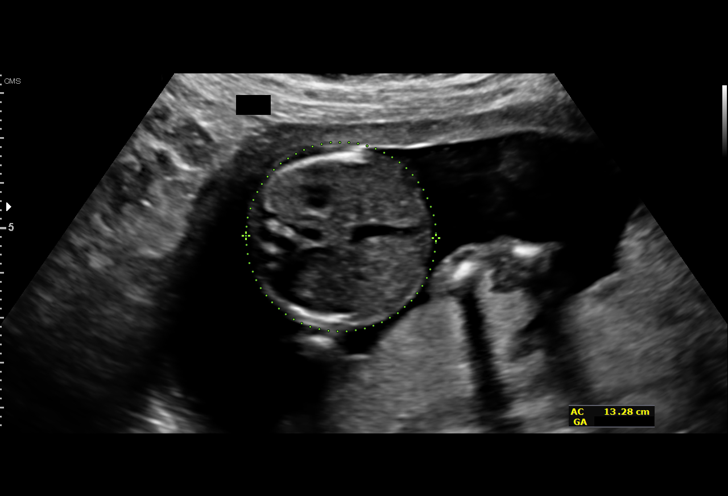
[im 82/89]
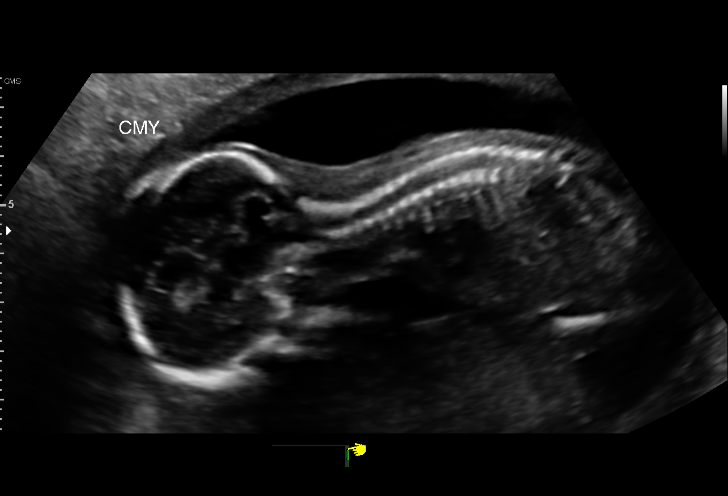
[im 89/89]
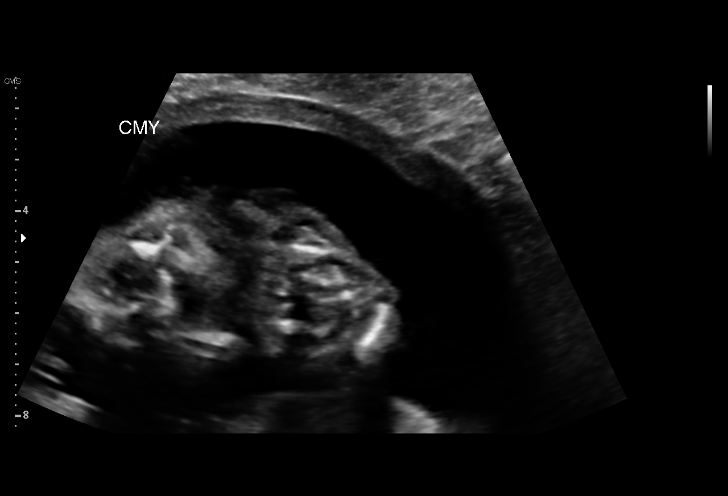

[14 of 28 positions shown; findings below may reference images not displayed]

Indications

18 weeks gestation of pregnancy
Detailed fetal anatomic survey                 Z36
Abnormal biochemical screen (quad) for
Trisomy 18 ([DATE])
Previous cesarean delivery, antepartum
OB History

Gravidity:    4         Term:   2        Prem:   0        SAB:   1
TOP:          0       Ectopic:  0        Living: 2
Fetal Evaluation

Num Of Fetuses:     1
Fetal Heart         132
Rate(bpm):
Cardiac Activity:   Observed
Presentation:       Breech
Placenta:           Posterior, above cervical os
P. Cord Insertion:  Visualized, central

Amniotic Fluid
AFI FV:      Subjectively within normal limits

Largest Pocket(cm)
6.69
Biometry

BPD:      41.1  mm     G. Age:  18w 3d         46  %    CI:         68.3   %   70 - 86
FL/HC:      16.9   %   16.1 -
HC:       159   mm     G. Age:  18w 6d         52  %    HC/AC:      1.20       1.09 -
AC:      132.8  mm     G. Age:  18w 5d         53  %    FL/BPD:     65.5   %
FL:       26.9  mm     G. Age:  18w 1d         31  %    FL/AC:      20.3   %   20 - 24
HUM:      26.3  mm     G. Age:  18w 2d         45  %
Est. FW:     244  gm      0 lb 9 oz     45  %
Gestational Age

LMP:           18w 4d       Date:   03/30/16                 EDD:   01/04/17
U/S Today:     18w 4d                                        EDD:   01/04/17
Best:          18w 4d    Det. By:   LMP  (03/30/16)          EDD:   01/04/17
Anatomy

Cranium:               Appears normal         Aortic Arch:            Appears normal
Cavum:                 Appears normal         Ductal Arch:            Appears normal
Ventricles:            Appears normal         Diaphragm:              Appears normal
Choroid Plexus:        Appears normal         Stomach:                Appears normal, left
sided
Cerebellum:            Appears normal         Abdomen:                Appears normal
Posterior Fossa:       Appears normal         Abdominal Wall:         Appears nml (cord
insert, abd wall)
Nuchal Fold:           Appears normal         Cord Vessels:           Appears normal (3
vessel cord)
Face:                  Appears normal         Kidneys:                Appear normal
(orbits and profile)
Lips:                  Appears normal         Bladder:                Appears normal
Thoracic:              Appears normal         Spine:                  Appears normal
Heart:                 Appears normal         Upper Extremities:      Appears normal
(4CH, axis, and
situs)
RVOT:                  Appears normal         Lower Extremities:      Appears normal
LVOT:                  Appears normal

Other:  Fetus appears to be a female. Heels and 5th digit visualized. Nasal
bone visualized.
Cervix Uterus Adnexa

Cervix
Length:            3.7  cm.
Normal appearance by transabdominal scan.

Uterus
No abnormality visualized.

Left Ovary
Within normal limits.

Right Ovary
Within normal limits.

Cul De Sac:   No free fluid seen.

Adnexa:       No abnormality visualized.
Impression

SIUP at 18+4 weeks
Normal detailed fetal anatomy
Markers of aneuploidy: none
Normal amniotic fluid volume
Measurements consistent with LMP dating

The US findings were shared with Ms. Tianyu.  The
implications of a normal detailed US on her T18 risk were
discussed in detail. All of her prenatal testing options were
reviewed. After careful consideration, she declined genetic
counseling, amniocentesis and cell free DNA screening.
Recommendations

Follow-up as clinically indicated

## 2018-01-14 ENCOUNTER — Ambulatory Visit: Payer: Self-pay | Admitting: *Deleted

## 2018-01-14 DIAGNOSIS — Z3201 Encounter for pregnancy test, result positive: Secondary | ICD-10-CM | POA: Diagnosis present

## 2018-01-14 DIAGNOSIS — Z3200 Encounter for pregnancy test, result unknown: Secondary | ICD-10-CM

## 2018-01-14 LAB — POCT PREGNANCY, URINE: Preg Test, Ur: POSITIVE — AB

## 2018-01-14 NOTE — Progress Notes (Signed)
Pt informed of positive pregnancy test result. Medication reconciliation completed. LMP - 11/26/17,  EDD 09/02/18. Pt desires prenatal care @ this office - will schedule.

## 2018-01-15 NOTE — Progress Notes (Signed)
Chart reviewed for nurse visit. Agree with plan of care.   Shahan Starks Lorraine, CNM 01/15/2018 5:49 PM   

## 2018-02-14 ENCOUNTER — Encounter: Payer: Self-pay | Admitting: General Practice

## 2018-02-14 ENCOUNTER — Encounter: Payer: Self-pay | Admitting: Advanced Practice Midwife

## 2018-02-14 NOTE — Progress Notes (Signed)
Patient was late for appointment today.  Asked provider if she would still be able to see patient.  Provider advised me to reschedule patient.  Patient voiced understanding.

## 2018-02-23 ENCOUNTER — Encounter: Payer: Self-pay | Admitting: Advanced Practice Midwife

## 2018-02-23 ENCOUNTER — Ambulatory Visit (INDEPENDENT_AMBULATORY_CARE_PROVIDER_SITE_OTHER): Payer: Medicaid Other | Admitting: Advanced Practice Midwife

## 2018-02-23 ENCOUNTER — Other Ambulatory Visit (HOSPITAL_COMMUNITY)
Admission: RE | Admit: 2018-02-23 | Discharge: 2018-02-23 | Disposition: A | Discharge status: Home / Self Care | Payer: Medicaid Other | Source: Ambulatory Visit | Attending: Advanced Practice Midwife | Admitting: Advanced Practice Midwife

## 2018-02-23 VITALS — BP 98/54 | HR 77 | Wt 184.1 lb

## 2018-02-23 DIAGNOSIS — Z3480 Encounter for supervision of other normal pregnancy, unspecified trimester: Secondary | ICD-10-CM

## 2018-02-23 DIAGNOSIS — N898 Other specified noninflammatory disorders of vagina: Secondary | ICD-10-CM

## 2018-02-23 DIAGNOSIS — Z98891 History of uterine scar from previous surgery: Secondary | ICD-10-CM

## 2018-02-23 DIAGNOSIS — Z3A00 Weeks of gestation of pregnancy not specified: Secondary | ICD-10-CM | POA: Diagnosis not present

## 2018-02-23 DIAGNOSIS — Z3481 Encounter for supervision of other normal pregnancy, first trimester: Secondary | ICD-10-CM

## 2018-02-23 LAB — POCT URINALYSIS DIP (DEVICE)
Bilirubin Urine: NEGATIVE
GLUCOSE, UA: NEGATIVE mg/dL
Hgb urine dipstick: NEGATIVE
Ketones, ur: NEGATIVE mg/dL
LEUKOCYTES UA: NEGATIVE
NITRITE: NEGATIVE
Protein, ur: NEGATIVE mg/dL
Specific Gravity, Urine: 1.02 (ref 1.005–1.030)
UROBILINOGEN UA: 0.2 mg/dL (ref 0.0–1.0)
pH: 5.5 (ref 5.0–8.0)

## 2018-02-23 MED ORDER — CONCEPT OB 130-92.4-1 MG PO CAPS: 1.0000 | ORAL_CAPSULE | Freq: Every day | ORAL | 12 refills | Status: AC

## 2018-02-23 NOTE — Progress Notes (Signed)
  Subjective:    Sabrina Bray is being seen today for her first obstetrical visit.  This is not a planned pregnancy. She is at 428w5d gestation. Her obstetrical history is significant for C/S x 3. Relationship with FOB: spouse, living together. Patient does intend to breast feed. Pregnancy history fully reviewed. Certain LMP w/ irreg cycles. Patient's last menstrual period was 11/26/2017 (exact date).  Patient reports vaginal odor and feeling dizzy after eating sweets. .  Review of Systems:   Review of Systems  Constitutional: Negative for chills and fever.  Gastrointestinal: Negative for abdominal pain.  Genitourinary: Negative for vaginal bleeding and vaginal discharge.  Neurological: Positive for dizziness.    Objective:     BP (!) 98/54   Pulse 77   Wt 184 lb 1.6 oz (83.5 kg)   LMP 11/26/2017 (Exact Date)   BMI 33.67 kg/m  Physical Exam  Constitutional: She appears well-developed and well-nourished. No distress.  HENT:  Head: Normocephalic and atraumatic.  Eyes: No scleral icterus.  Cardiovascular: Normal rate, regular rhythm and normal heart sounds.  Respiratory: Effort normal and breath sounds normal. No respiratory distress.  GI: Soft. There is no tenderness.  Low transverse scar. Keloids present.     Maternal Exam:  Abdomen: Patient reports no abdominal tenderness. Surgical scars: low transverse.   Fundal height is 13.    Introitus: Normal vulva. Normal vagina.  Pelvis: adequate for delivery.   Cervix: Cervix evaluated by digital exam.     Fetal Exam Fetal Monitor Review: Mode: hand-held doppler probe.   Baseline rate: 150.         Assessment:    Pregnancy: [id number] Patient Active Problem List   Diagnosis Date Noted  . History of 2 cesarean sections 12/30/2016  . Positive urine drug screen 06/27/2016  . Encounter for supervision of normal pregnancy in multigravida 06/22/2016  . Previous cesarean section 06/22/2016       Plan:  1. Encounter for  supervision of normal pregnancy in multigravida  - Culture, OB Urine - Cystic fibrosis gene test - Hemoglobinopathy Evaluation - Obstetric Panel, Including HIV - SMN1 COPY NUMBER ANALYSIS (SMA Carrier Screen) - Prenat w/o A Vit-FeFum-FePo-FA (CONCEPT OB) 130-92.4-1 MG CAPS; Take 1 tablet by mouth daily.  Dispense: 30 capsule; Refill: 12  2. Vaginal discharge  - Cervicovaginal ancillary only  3. History of 3 cesarean sections  - Needs repeat at 39 weeks. Discussed increased risk of uterine rupture w/ multiple C/S. Recommend LARC or sterilization.     Initial labs drawn. Prenatal vitamins. Problem list reviewed and updated. AFP3 discussed: declined all genetic screening. Role of ultrasound in pregnancy discussed; fetal survey: requested. Amniocentesis discussed: not indicated. Follow up in 4 weeks. Will get dating US due to irreg cycles.   Dorathy KinsmanVirginia Hill Mackie 02/23/2018

## 2018-02-23 NOTE — Progress Notes (Signed)
Pt reports having a vaginal discharge with musty odor.

## 2018-02-23 NOTE — Patient Instructions (Signed)

## 2018-02-25 LAB — CERVICOVAGINAL ANCILLARY ONLY
Bacterial vaginitis: NEGATIVE
Candida vaginitis: NEGATIVE
Chlamydia: NEGATIVE
Neisseria Gonorrhea: NEGATIVE
Trichomonas: NEGATIVE

## 2018-02-25 LAB — URINE CULTURE, OB REFLEX

## 2018-02-25 LAB — CULTURE, OB URINE

## 2018-02-28 ENCOUNTER — Ambulatory Visit: Payer: Self-pay

## 2018-02-28 ENCOUNTER — Ambulatory Visit (INDEPENDENT_AMBULATORY_CARE_PROVIDER_SITE_OTHER): Payer: Medicaid Other | Admitting: *Deleted

## 2018-02-28 VITALS — BP 123/66 | HR 92 | Wt 184.5 lb

## 2018-02-28 DIAGNOSIS — Z3480 Encounter for supervision of other normal pregnancy, unspecified trimester: Secondary | ICD-10-CM

## 2018-02-28 DIAGNOSIS — Z3490 Encounter for supervision of normal pregnancy, unspecified, unspecified trimester: Secondary | ICD-10-CM

## 2018-02-28 DIAGNOSIS — Z3687 Encounter for antenatal screening for uncertain dates: Secondary | ICD-10-CM | POA: Diagnosis not present

## 2018-03-01 ENCOUNTER — Encounter: Payer: Self-pay | Admitting: *Deleted

## 2018-03-03 LAB — SMN1 COPY NUMBER ANALYSIS (SMA CARRIER SCREENING)

## 2018-03-03 LAB — OBSTETRIC PANEL, INCLUDING HIV
Antibody Screen: NEGATIVE
BASOS ABS: 0 10*3/uL (ref 0.0–0.2)
Basos: 0 %
EOS (ABSOLUTE): 0 10*3/uL (ref 0.0–0.4)
Eos: 1 %
HEP B S AG: NEGATIVE
HIV Screen 4th Generation wRfx: NONREACTIVE
Hematocrit: 39.5 % (ref 34.0–46.6)
Hemoglobin: 13.2 g/dL (ref 11.1–15.9)
IMMATURE GRANULOCYTES: 0 %
Immature Grans (Abs): 0 10*3/uL (ref 0.0–0.1)
LYMPHS ABS: 2 10*3/uL (ref 0.7–3.1)
Lymphs: 30 %
MCH: 27 pg (ref 26.6–33.0)
MCHC: 33.4 g/dL (ref 31.5–35.7)
MCV: 81 fL (ref 79–97)
MONOS ABS: 0.3 10*3/uL (ref 0.1–0.9)
Monocytes: 4 %
NEUTROS ABS: 4.4 10*3/uL (ref 1.4–7.0)
NEUTROS PCT: 65 %
PLATELETS: 243 10*3/uL (ref 150–379)
RBC: 4.88 x10E6/uL (ref 3.77–5.28)
RDW: 15.7 % — AB (ref 12.3–15.4)
RPR Ser Ql: NONREACTIVE
Rh Factor: POSITIVE
Rubella Antibodies, IGG: 4.35 index (ref >0.99)
WBC: 6.7 10*3/uL (ref 3.4–10.8)

## 2018-03-03 LAB — CYSTIC FIBROSIS GENE TEST

## 2018-03-03 LAB — HEMOGLOBINOPATHY EVALUATION
Ferritin: 23 ng/mL (ref 15–150)
HGB A2 QUANT: 2.3 % (ref 1.8–3.2)
HGB A: 97.7 % (ref 96.4–98.8)
HGB C: 0 %
HGB F QUANT: 0 % (ref 0.0–2.0)
HGB VARIANT: 0 %
Hgb S: 0 %
Hgb Solubility: NEGATIVE

## 2018-03-15 ENCOUNTER — Encounter: Payer: Self-pay | Admitting: *Deleted

## 2018-03-22 ENCOUNTER — Encounter: Payer: Self-pay | Admitting: Student

## 2018-03-22 DIAGNOSIS — O09892 Supervision of other high risk pregnancies, second trimester: Secondary | ICD-10-CM | POA: Insufficient documentation

## 2018-03-23 ENCOUNTER — Encounter: Payer: Self-pay | Admitting: Student

## 2018-03-23 ENCOUNTER — Telehealth: Payer: Self-pay | Admitting: Family Medicine

## 2018-03-23 NOTE — Telephone Encounter (Signed)
patient called to cancel she siad her car was broke down and not sure when she will get it fixed, said she will call us when her car is working

## 2018-04-11 ENCOUNTER — Ambulatory Visit (INDEPENDENT_AMBULATORY_CARE_PROVIDER_SITE_OTHER): Payer: Medicaid Other | Admitting: Certified Nurse Midwife

## 2018-04-11 VITALS — BP 111/73 | HR 84 | Wt 187.8 lb

## 2018-04-11 DIAGNOSIS — Z3480 Encounter for supervision of other normal pregnancy, unspecified trimester: Secondary | ICD-10-CM

## 2018-04-11 DIAGNOSIS — Z98891 History of uterine scar from previous surgery: Secondary | ICD-10-CM

## 2018-04-11 NOTE — Progress Notes (Signed)
   PRENATAL VISIT NOTE  Subjective:  Sabrina Bray is a 32 y.o. [id number] at [redacted]w[redacted]d being seen today for ongoing prenatal care.  She is currently monitored for the following issues for this low-risk pregnancy and has Encounter for supervision of normal pregnancy in multigravida; Previous cesarean section; and Short interval between pregnancies affecting pregnancy in second trimester, antepartum on their problem list.  Patient reports no complaints.  Contractions: Irritability. Vag. Bleeding: None.  Movement: Present. Denies leaking of fluid.   The following portions of the patient's history were reviewed and updated as appropriate: allergies, current medications, past family history, past medical history, past social history, past surgical history and problem list. Problem list updated.  Objective:   Vitals:   04/11/18 0944  BP: 111/73  Pulse: 84  Weight: 187 lb 12.8 oz (85.2 kg)    Fetal Status: Fetal Heart Rate (bpm): 140   Movement: Present     General:  Alert, oriented and cooperative. Patient is in no acute distress.  Skin: Skin is warm and dry. No rash noted.   Cardiovascular: Normal heart rate noted  Respiratory: Normal respiratory effort, no problems with respiration noted  Abdomen: Soft, gravid, appropriate for gestational age.  Pain/Pressure: Present     Pelvic: Cervical exam deferred        Extremities: Normal range of motion.  Edema: None  Mental Status: Normal mood and affect. Normal behavior. Normal judgment and thought content.   Assessment and Plan:  Pregnancy: [id number] at [redacted]w[redacted]d  1. Encounter for supervision of normal pregnancy in multigravida -Patient doing well, no complaints  -Reviewed initial prenatal labs with patient.  -Anatomy US ordered and scheduled.  - [place] MFM OB COMP + 14 WK; Future  2. History of C/S x3 - Plans repeat C/S at 39 weeks   Preterm labor symptoms and general obstetric precautions including but not limited to vaginal bleeding, contractions,  leaking of fluid and fetal movement were reviewed in detail with the patient. Please refer to After Visit Summary for other counseling recommendations.  Return in about 1 month (around 05/09/2018) for ROB.  Future Appointments  Date Time Provider Department Center  04/20/2018  1:45 PM [facility] [place] 2 [facility]  05/11/2018 11:15 AM Hagan Bing, MD [facility]    Sharyon Cable, CNM

## 2018-04-11 NOTE — Progress Notes (Signed)
Pt states having Carpal tunnel

## 2018-04-11 NOTE — Patient Instructions (Signed)
Second Trimester of Pregnancy The second trimester is from week 13 through week 28, month 4 through 6. This is often the time in pregnancy that you feel your best. Often times, morning sickness has lessened or quit. You may have more energy, and you may get hungry more often. Your unborn baby (fetus) is growing rapidly. At the end of the sixth month, he or she is about 9 inches long and weighs about 1 pounds. You will likely feel the baby move (quickening) between 18 and 20 weeks of pregnancy. Follow these instructions at home:  Avoid all smoking, herbs, and alcohol. Avoid drugs not approved by your doctor.  Do not use any tobacco products, including cigarettes, chewing tobacco, and electronic cigarettes. If you need help quitting, ask your doctor. You may get counseling or other support to help you quit.  Only take medicine as told by your doctor. Some medicines are safe and some are not during pregnancy.  Exercise only as told by your doctor. Stop exercising if you start having cramps.  Eat regular, healthy meals.  Wear a good support bra if your breasts are tender.  Do not use hot tubs, steam rooms, or saunas.  Wear your seat belt when driving.  Avoid raw meat, uncooked cheese, and liter boxes and soil used by cats.  Take your prenatal vitamins.  Take 1500-2000 milligrams of calcium daily starting at the 20th week of pregnancy until you deliver your baby.  Try taking medicine that helps you poop (stool softener) as needed, and if your doctor approves. Eat more fiber by eating fresh fruit, vegetables, and whole grains. Drink enough fluids to keep your pee (urine) clear or pale yellow.  Take warm water baths (sitz baths) to soothe pain or discomfort caused by hemorrhoids. Use hemorrhoid cream if your doctor approves.  If you have puffy, bulging veins (varicose veins), wear support hose. Raise (elevate) your feet for 15 minutes, 3-4 times a day. Limit salt in your diet.  Avoid heavy  lifting, wear low heals, and sit up straight.  Rest with your legs raised if you have leg cramps or low back pain.  Visit your dentist if you have not gone during your pregnancy. Use a soft toothbrush to brush your teeth. Be gentle when you floss.  You can have sex (intercourse) unless your doctor tells you not to.  Go to your doctor visits. Get help if:  You feel dizzy.  You have mild cramps or pressure in your lower belly (abdomen).  You have a nagging pain in your belly area.  You continue to feel sick to your stomach (nauseous), throw up (vomit), or have watery poop (diarrhea).  You have bad smelling fluid coming from your vagina.  You have pain with peeing (urination). Get help right away if:  You have a fever.  You are leaking fluid from your vagina.  You have spotting or bleeding from your vagina.  You have severe belly cramping or pain.  You lose or gain weight rapidly.  You have trouble catching your breath and have chest pain.  You notice sudden or extreme puffiness (swelling) of your face, hands, ankles, feet, or legs.  You have not felt the baby move in over an hour.  You have severe headaches that do not go away with medicine.  You have vision changes. This information is not intended to replace advice given to you by your health care provider. Make sure you discuss any questions you have with your health care   provider. Document Released: 02/10/2010 Document Revised: 04/23/2016 Document Reviewed: 01/17/2013 Elsevier Interactive Patient Education  2017 Elsevier Inc.  

## 2018-04-20 ENCOUNTER — Other Ambulatory Visit: Payer: Self-pay | Admitting: Certified Nurse Midwife

## 2018-04-20 ENCOUNTER — Ambulatory Visit (HOSPITAL_COMMUNITY)
Admission: RE | Admit: 2018-04-20 | Discharge: 2018-04-20 | Disposition: A | Discharge status: Home / Self Care | Payer: Medicaid Other | Source: Ambulatory Visit | Attending: Certified Nurse Midwife | Admitting: Certified Nurse Midwife

## 2018-04-20 DIAGNOSIS — Z3A19 19 weeks gestation of pregnancy: Secondary | ICD-10-CM

## 2018-04-20 DIAGNOSIS — Z3480 Encounter for supervision of other normal pregnancy, unspecified trimester: Secondary | ICD-10-CM

## 2018-04-20 DIAGNOSIS — Z363 Encounter for antenatal screening for malformations: Secondary | ICD-10-CM

## 2018-05-11 ENCOUNTER — Encounter: Payer: Medicaid Other | Admitting: Obstetrics and Gynecology

## 2018-05-17 ENCOUNTER — Other Ambulatory Visit: Payer: Self-pay | Admitting: Pediatrics

## 2018-05-17 DIAGNOSIS — Z207 Contact with and (suspected) exposure to pediculosis, acariasis and other infestations: Secondary | ICD-10-CM

## 2018-05-17 DIAGNOSIS — Z2089 Contact with and (suspected) exposure to other communicable diseases: Secondary | ICD-10-CM

## 2018-05-17 MED ORDER — PERMETHRIN 5 % EX CREA: 1.0000 "application " | TOPICAL_CREAM | Freq: Once | CUTANEOUS | 0 refills | Status: AC

## 2018-05-17 NOTE — Progress Notes (Signed)
Her daughter was seen in clinic today with scabies.  All household contacts are being treated at the same time. Rx for permethrin 5% cream sent to the pharmacy.  Patient is pregnant which is not a contraindication to permethrin cream.  Recommend that mother call and speak with her OB office so they are aware of her treatment.

## 2018-05-19 ENCOUNTER — Encounter: Payer: Medicaid Other | Admitting: Student

## 2018-06-03 ENCOUNTER — Ambulatory Visit (INDEPENDENT_AMBULATORY_CARE_PROVIDER_SITE_OTHER): Payer: Medicaid Other | Admitting: Obstetrics and Gynecology

## 2018-06-03 VITALS — BP 119/64 | HR 88 | Wt 189.3 lb

## 2018-06-03 DIAGNOSIS — Z0489 Encounter for examination and observation for other specified reasons: Secondary | ICD-10-CM

## 2018-06-03 DIAGNOSIS — Z3482 Encounter for supervision of other normal pregnancy, second trimester: Secondary | ICD-10-CM

## 2018-06-03 DIAGNOSIS — Z3480 Encounter for supervision of other normal pregnancy, unspecified trimester: Secondary | ICD-10-CM

## 2018-06-03 DIAGNOSIS — IMO0002 Reserved for concepts with insufficient information to code with codable children: Secondary | ICD-10-CM

## 2018-06-03 NOTE — Patient Instructions (Signed)

## 2018-06-03 NOTE — Progress Notes (Signed)
Follow up Anatomy U/S Scheduled for 06/16/18 @ 4pm

## 2018-06-03 NOTE — Progress Notes (Signed)
   PRENATAL VISIT NOTE  Subjective:  Sabrina Bray is a 32 y.o. [id number] at 6332w0d being seen today for ongoing prenatal care.  She is currently monitored for the following issues for this low-risk pregnancy and has Encounter for supervision of normal pregnancy in multigravida; Previous cesarean section; and Short interval between pregnancies affecting pregnancy in second trimester, antepartum on their problem list.  Patient reports no complaints.  Contractions: Irritability. Vag. Bleeding: None.  Movement: Present. Denies leaking of fluid.   The following portions of the patient's history were reviewed and updated as appropriate: allergies, current medications, past family history, past medical history, past social history, past surgical history and problem list. Problem list updated.  Objective:   Vitals:   06/03/18 1141  BP: 119/64  Pulse: 88  Weight: 189 lb 4.8 oz (85.9 kg)    Fetal Status: Fetal Heart Rate (bpm): 141 Fundal Height: 27 cm Movement: Present     General:  Alert, oriented and cooperative. Patient is in no acute distress.  Skin: Skin is warm and dry. No rash noted.   Cardiovascular: Normal heart rate noted  Respiratory: Normal respiratory effort, no problems with respiration noted  Abdomen: Soft, gravid, appropriate for gestational age.  Pain/Pressure: Present     Pelvic: Cervical exam deferred        Extremities: Normal range of motion.  Edema: None  Mental Status: Normal mood and affect. Normal behavior. Normal judgment and thought content.   Assessment and Plan:  Pregnancy: G5P3013 at 4632w0d  1. Encounter for supervision of normal pregnancy in multigravida  Doing well   2. Evaluate anatomy not seen on prior sonogram  - US MFM OB FOLLOW UP; Future   Preterm labor symptoms and general obstetric precautions including but not limited to vaginal bleeding, contractions, leaking of fluid and fetal movement were reviewed in detail with the patient. Please refer  to After Visit Summary for other counseling recommendations.  Return in about 2 weeks (around 06/17/2018) for Come fasting to this appointment. .  Future Appointments  Date Time Provider Department Center  06/16/2018  4:00 PM [facility] US 3 [facility]-US  06/23/2018  8:15 AM Marylene LandKooistra, Kathryn Lorraine, CNM [facility]  06/23/2018  9:30 AM [facility] LAB [facility]    Venia CarbonJennifer Brit Carbonell, NP

## 2018-06-16 ENCOUNTER — Ambulatory Visit (HOSPITAL_COMMUNITY)
Admission: RE | Admit: 2018-06-16 | Discharge: 2018-06-16 | Disposition: A | Discharge status: Home / Self Care | Payer: Medicaid Other | Source: Ambulatory Visit | Attending: Obstetrics and Gynecology | Admitting: Obstetrics and Gynecology

## 2018-06-16 DIAGNOSIS — IMO0002 Reserved for concepts with insufficient information to code with codable children: Secondary | ICD-10-CM

## 2018-06-16 DIAGNOSIS — Z362 Encounter for other antenatal screening follow-up: Secondary | ICD-10-CM | POA: Diagnosis not present

## 2018-06-16 DIAGNOSIS — O34219 Maternal care for unspecified type scar from previous cesarean delivery: Secondary | ICD-10-CM | POA: Diagnosis not present

## 2018-06-16 DIAGNOSIS — Z0489 Encounter for examination and observation for other specified reasons: Secondary | ICD-10-CM

## 2018-06-16 DIAGNOSIS — Z3A27 27 weeks gestation of pregnancy: Secondary | ICD-10-CM | POA: Insufficient documentation

## 2018-06-23 ENCOUNTER — Encounter: Payer: Medicaid Other | Admitting: Student

## 2018-06-23 ENCOUNTER — Other Ambulatory Visit: Payer: Medicaid Other

## 2018-07-06 ENCOUNTER — Other Ambulatory Visit: Payer: Self-pay | Admitting: *Deleted

## 2018-07-06 DIAGNOSIS — Z3480 Encounter for supervision of other normal pregnancy, unspecified trimester: Secondary | ICD-10-CM

## 2018-07-07 ENCOUNTER — Other Ambulatory Visit: Payer: Medicaid Other

## 2018-07-07 ENCOUNTER — Ambulatory Visit (INDEPENDENT_AMBULATORY_CARE_PROVIDER_SITE_OTHER): Payer: Medicaid Other | Admitting: Student

## 2018-07-07 VITALS — BP 106/75 | HR 89 | Wt 188.0 lb

## 2018-07-07 DIAGNOSIS — Z98891 History of uterine scar from previous surgery: Secondary | ICD-10-CM

## 2018-07-07 DIAGNOSIS — Z3480 Encounter for supervision of other normal pregnancy, unspecified trimester: Secondary | ICD-10-CM

## 2018-07-07 DIAGNOSIS — Z23 Encounter for immunization: Secondary | ICD-10-CM

## 2018-07-07 DIAGNOSIS — Z3483 Encounter for supervision of other normal pregnancy, third trimester: Secondary | ICD-10-CM | POA: Diagnosis not present

## 2018-07-07 DIAGNOSIS — O34219 Maternal care for unspecified type scar from previous cesarean delivery: Secondary | ICD-10-CM

## 2018-07-07 NOTE — Patient Instructions (Signed)
Cesarean Delivery Cesarean birth, or cesarean delivery, is the surgical delivery of a baby through an incision in the abdomen and the uterus. This may be referred to as a C-section. This procedure may be scheduled ahead of time, or it may be done in an emergency situation. Tell a health care provider about:  Any allergies you have.  All medicines you are taking, including vitamins, herbs, eye drops, creams, and over-the-counter medicines.  Any problems you or family members have had with anesthetic medicines.  Any blood disorders you have.  Any surgeries you have had.  Any medical conditions you have.  Whether you or any members of your family have a history of deep vein thrombosis (DVT) or pulmonary embolism (PE). What are the risks? Generally, this is a safe procedure. However, problems may occur, including:  Infection.  Bleeding.  Allergic reactions to medicines.  Damage to other structures or organs.  Blood clots.  Injury to your baby.  What happens before the procedure?  Follow instructions from your health care provider about eating or drinking restrictions.  Follow instructions from your health care provider about bathing before your procedure to help reduce your risk of infection.  If you know that you are going to have a cesarean delivery, do not shave your pubic area. Shaving before the procedure may increase your risk of infection.  Ask your health care provider about: ? Changing or stopping your regular medicines. This is especially important if you are taking diabetes medicines or blood thinners. ? Your pain management plan. This is especially important if you plan to breastfeed your baby. ? How long you will be in the hospital after the procedure. ? Any concerns you may have about receiving blood products if you need them during the procedure. ? Cord blood banking, if you plan to collect your baby's umbilical cord blood.  You may also want to ask your  health care provider: ? Whether you will be able to hold or breastfeed your baby while you are still in the operating room. ? Whether your baby can stay with you immediately after the procedure and during your recovery. ? Whether a family member or a person of your choice can go with you into the operating room and stay with you during the procedure, immediately after the procedure, and during your recovery.  Plan to have someone drive you home when you are discharged from the hospital. What happens during the procedure?  Fetal monitors will be placed on your abdomen to monitor your heart rate and your baby's heart rate.  Depending on the reason for your cesarean delivery, you may have a physical exam or additional testing, such as an ultrasound.  An IV tube will be inserted into one of your veins.  You may have your blood or urine tested.  You will be given antibiotic medicine to help prevent infection.  You may be given a special warming gown to wear to keep your temperature stable.  Hair may be removed from your pubic area.  The skin of your pubic area and lower abdomen will be cleaned with a germ-killing solution (antiseptic).  A catheter may be inserted into your bladder through your urethra. This drains your urine during the procedure.  You may be given one or more of the following: ? A medicine to numb the area (local anesthetic). ? A medicine to make you fall asleep (general anesthetic). ? A medicine (regional anesthetic) that is injected into your back or through a small   thin tube placed in your back (spinal anesthetic or epidural anesthetic). This numbs everything below the injection site and allows you to stay awake during your procedure. If this makes you feel nauseous, tell your health care provider. Medicines will be available to help reduce any nausea you may feel.  An incision will be made in your abdomen, and then in your uterus.  If you are awake during your  procedure, you may feel tugging and pulling in your abdomen, but you should not feel pain. If you feel pain, tell your health care provider immediately.  Your baby will be removed from your uterus. You may feel more pressure or pushing while this happens.  Immediately after birth, your baby will be dried and kept warm. You may be able to hold and breastfeed your baby. The umbilical cord may be clamped and cut during this time.  Your placenta will be removed from your uterus.  Your incisions will be closed with stitches (sutures). Staples, skin glue, or adhesive strips may also be applied to the incision in your abdomen.  Bandages (dressings) will be placed over the incision in your abdomen. The procedure may vary among health care providers and hospitals. What happens after the procedure?  Your blood pressure, heart rate, breathing rate, and blood oxygen level will be monitored often until the medicines you were given have worn off.  You may continue to receive fluids and medicines through an IV tube.  You will have some pain. Medicines will be available to help control your pain.  To help prevent blood clots: ? You may be given medicines. ? You may have to wear compression stockings or devices. ? You will be encouraged to walk around when you are able.  Hospital staff will encourage and support bonding with your baby. Your hospital may allow you and your baby to stay in the same room (rooming in) during your hospital stay to encourage successful breastfeeding.  You may be encouraged to cough and breathe deeply often. This helps to prevent lung problems.  If you have a catheter draining your urine, it will be removed as soon as possible after your procedure. This information is not intended to replace advice given to you by your health care provider. Make sure you discuss any questions you have with your health care provider. Document Released: 11/16/2005 Document Revised: 04/23/2016  Document Reviewed: 08/27/2015 Elsevier Interactive Patient Education  2018 Elsevier Inc.  

## 2018-07-07 NOTE — Progress Notes (Signed)
   PRENATAL VISIT NOTE  Subjective:  Sabrina Bray is a 32 y.o. [id number] at 4672w6d being seen today for ongoing prenatal care.  She is currently monitored for the following issues for this low-risk pregnancy and has Encounter for supervision of normal pregnancy in multigravida; Previous cesarean section; and Short interval between pregnancies affecting pregnancy in second trimester, antepartum on their problem list.  Patient reports no complaints.  Contractions: Irritability. Vag. Bleeding: None.  Movement: Present. Denies leaking of fluid.   The following portions of the patient's history were reviewed and updated as appropriate: allergies, current medications, past family history, past medical history, past social history, past surgical history and problem list. Problem list updated.  Objective:   Vitals:   07/07/18 0849  BP: 106/75  Pulse: 89  Weight: 188 lb (85.3 kg)    Fetal Status: Fetal Heart Rate (bpm): 136 Fundal Height: 32 cm Movement: Present     General:  Alert, oriented and cooperative. Patient is in no acute distress.  Skin: Skin is warm and dry. No rash noted.   Cardiovascular: Normal heart rate noted  Respiratory: Normal respiratory effort, no problems with respiration noted  Abdomen: Soft, gravid, appropriate for gestational age.  Pain/Pressure: Present     Pelvic: Cervical exam deferred        Extremities: Normal range of motion.  Edema: None  Mental Status: Normal mood and affect. Normal behavior. Normal judgment and thought content.   Assessment and Plan:  Pregnancy: [id number] at 3072w6d  1. Encounter for supervision of normal pregnancy in multigravida -Patient does not want any hormonal methods; is undecided about BTL because it is permanent. Might consider paraguard. I strongly encouraged her to consider her future fertility and recommended she consider trying a LARC to see how she likes it.  -Signed C-section consent form today; declines TOLAC.  -Patient does not  want another US for growth and PACs; declined repeat today.   2. Need for Tdap vaccination  - Tdap vaccine greater than or equal to 7yo IM  Preterm labor symptoms and general obstetric precautions including but not limited to vaginal bleeding, contractions, leaking of fluid and fetal movement were reviewed in detail with the patient. Please refer to After Visit Summary for other counseling recommendations.  Return in about 2 weeks (around 07/21/2018), or LROB.  Future Appointments  Date Time Provider Department Center  07/07/2018  9:30 AM [facility]    [address] Lor[address] GratiotKooistra, [place]

## 2018-07-09 LAB — HIV ANTIBODY (ROUTINE TESTING W REFLEX): HIV Screen 4th Generation wRfx: NONREACTIVE

## 2018-07-09 LAB — CBC
HEMATOCRIT: 34.5 % (ref 34.0–46.6)
HEMOGLOBIN: 11.2 g/dL (ref 11.1–15.9)
MCH: 27.9 pg (ref 26.6–33.0)
MCHC: 32.5 g/dL (ref 31.5–35.7)
MCV: 86 fL (ref 79–97)
Platelets: 183 10*3/uL (ref 150–450)
RBC: 4.02 x10E6/uL (ref 3.77–5.28)
RDW: 13.4 % (ref 12.3–15.4)
WBC: 5.6 10*3/uL (ref 3.4–10.8)

## 2018-07-09 LAB — GLUCOSE TOLERANCE, 2 HOURS W/ 1HR
GLUCOSE, 2 HOUR: 65 mg/dL (ref 65–152)
Glucose, 1 hour: 113 mg/dL (ref 65–179)
Glucose, Fasting: 87 mg/dL (ref 65–91)

## 2018-07-09 LAB — RPR: RPR: NONREACTIVE

## 2018-07-14 ENCOUNTER — Telehealth: Payer: Self-pay | Admitting: General Practice

## 2018-07-14 NOTE — Telephone Encounter (Signed)
Patient called & left message on nurse voicemail line requesting results. Called patient & she states [place] [facility]. Patient had no questions.

## 2018-07-20 ENCOUNTER — Encounter: Payer: Self-pay | Admitting: *Deleted

## 2018-07-25 ENCOUNTER — Ambulatory Visit (INDEPENDENT_AMBULATORY_CARE_PROVIDER_SITE_OTHER): Payer: Medicaid Other | Admitting: Advanced Practice Midwife

## 2018-07-25 ENCOUNTER — Encounter (HOSPITAL_COMMUNITY): Payer: Self-pay

## 2018-07-25 DIAGNOSIS — Z3480 Encounter for supervision of other normal pregnancy, unspecified trimester: Secondary | ICD-10-CM

## 2018-07-25 NOTE — Progress Notes (Signed)
Patient left to pick up her son without being seen. RN did see the patient and collected vital signs and FHT with doppler. She had no complaints per the RN.   Sabrina ShellerHeather Ladarrion Bray 2:38 PM 07/25/18

## 2018-07-29 ENCOUNTER — Telehealth: Payer: Self-pay

## 2018-07-29 ENCOUNTER — Other Ambulatory Visit: Payer: Self-pay

## 2018-07-29 ENCOUNTER — Encounter (HOSPITAL_COMMUNITY): Payer: Self-pay

## 2018-07-29 ENCOUNTER — Emergency Department (HOSPITAL_COMMUNITY)
Admission: EM | Admit: 2018-07-29 | Discharge: 2018-07-29 | Disposition: A | Discharge status: Home / Self Care | Payer: Medicaid Other | Attending: Emergency Medicine | Admitting: Emergency Medicine

## 2018-07-29 DIAGNOSIS — G510 Bell's palsy: Secondary | ICD-10-CM | POA: Diagnosis not present

## 2018-07-29 DIAGNOSIS — Z3A34 34 weeks gestation of pregnancy: Secondary | ICD-10-CM | POA: Diagnosis not present

## 2018-07-29 DIAGNOSIS — O99333 Smoking (tobacco) complicating pregnancy, third trimester: Secondary | ICD-10-CM | POA: Insufficient documentation

## 2018-07-29 DIAGNOSIS — F17290 Nicotine dependence, other tobacco product, uncomplicated: Secondary | ICD-10-CM | POA: Insufficient documentation

## 2018-07-29 DIAGNOSIS — O99353 Diseases of the nervous system complicating pregnancy, third trimester: Secondary | ICD-10-CM | POA: Diagnosis present

## 2018-07-29 MED ORDER — ARTIFICIAL TEARS OPHTHALMIC OINT: TOPICAL_OINTMENT | Freq: Three times a day (TID) | OPHTHALMIC | 0 refills | Status: AC

## 2018-07-29 MED ORDER — EYE PATCH MISC: 0 refills | Status: AC

## 2018-07-29 MED ORDER — EYE PATCH MISC: 0 refills | Status: DC

## 2018-07-29 NOTE — ED Provider Notes (Addendum)
Patient placed in Quick Look pathway, seen and evaluated   Chief Complaint: left side facial droop  HPI:   Sabrina Bray is a 32 y.o. [id number] @ 7168w0d gestation [facility]. Patient reports no pain. Patient states she began noting her tongue being numb 3 days ago and reports she could not taste sweet or sour on the left side. Then she began to notice facial droop on the left.   ROS: Neuro: facial numbness, droop  Physical Exam:  BP 120/83 (BP Location: Right Arm)   Pulse 94   Temp 98 F (36.7 C) (Oral)   Resp 20   Ht 5\' 2"  (1.575 m)   Wt 85.7 kg   LMP 11/26/2017 (Exact Date)   SpO2 99%   BMI 34.57 kg/m    Gen: No distress  Neuro: Awake and Alert, facial droop left side  Skin: Warm and dry  Abdomen: gravid c/w dates  Probably Bell's Palsy  Initiation of care has begun. The patient has been counseled on the process, plan, and necessity for staying for the completion/evaluation, and the remainder of the medical screening examination    Janne Napoleoneese, Jackqulyn Mendel M, NP 07/29/18 1617    Kerrie Buffaloeese, Jaegar Croft JonesboroughM, NP 07/29/18 1618    Azalia Bilisampos, Kevin, MD 07/30/18 [phone]

## 2018-07-29 NOTE — ED Provider Notes (Signed)
[facility] EMERGENCY DEPARTMENT Provider Note   CSN: [id number] Arrival date & time: 07/29/18  1552     History   Chief Complaint Chief Complaint  Patient presents with  . Numbness    HPI Theone Bowell is a 32 y.o. female who is G5 P3-0-1-3 and is currently 34 weeks 0 days pregnant who presents for evaluation of left-sided facial drooping.  She states that this is been ongoing for last 3 days.  Initially, her symptoms started with some numbness to the left side of her tongue she states that she could not take 3 or sour on the left side.  Then she states that she noted that her left side of her face was drooping.  Patient reports she has not had any difficulty seeing out of her left eye.  She denies any preceding trauma, injury.  Patient states that she called her OB/GYN who prompted him to come to the emergency department for further evaluation.  Patient denies any fevers, chest pain, difficulty breathing, numbness/weakness of her arms or legs, loss of fluids.  The history is provided by the patient.    Past Medical History:  Diagnosis Date  . Anemia    during pregnancy only  . Anxiety   . Environmental allergies   . Heart murmur    as child - no problems    Patient Active Problem List   Diagnosis Date Noted  . Short interval between pregnancies affecting pregnancy in second trimester, antepartum 03/22/2018  . Encounter for supervision of normal pregnancy in multigravida 06/22/2016  . Previous cesarean section 06/22/2016    Past Surgical History:  Procedure Laterality Date  . CESAREAN SECTION N/A 11/03/2011   due [place] failed [place] at 42 weeks [place]  . CESAREAN SECTION N/A 11/09/2013   Procedure: REPEAT CESAREAN SECTION;  Surgeon: Lesly Dukes, MD;  Location: [facility] ORS;  Service: Obstetrics;  Laterality: N/A;  . CESAREAN SECTION Bilateral 12/28/2016   Procedure: REPEAT CESAREAN SECTION;  Surgeon: Adam Phenix, MD;  Location: [facility] BIRTHING SUITES;   Service: Obstetrics;  Laterality: Bilateral;  . PILONIDAL CYST EXCISION N/A 02/2008, 2011   x 2 surgeries     OB History    Gravida  5   Para  3   Term  3   Preterm  0   AB  1   Living  3     SAB  1   TAB  0   Ectopic  0   Multiple  0   Live Births  3            Home Medications    Prior to Admission medications   Medication Sig Start Date End Date Taking? Authorizing Provider  artificial tears (LACRILUBE) OINT ophthalmic ointment Place into the left eye 3 (three) times daily. 07/29/18   Maxwell Caul, PA-C  Eye Patch MISC Place on left eye for comfort, especially when sleeping 07/29/18   Graciella Freer A, PA-C  Prenat w/o A Vit-FeFum-FePo-FA (CONCEPT OB) 130-92.4-1 MG CAPS Take 1 tablet by mouth daily. 02/23/18   Dorathy Kinsman, CNM    Family History Family History  Problem Relation Age of Onset  . Hypertension Maternal Grandmother   . Cancer Maternal Grandmother        ovarian   . Hypertension Paternal Grandmother   . Hypertension Mother     Social History Social History   Tobacco Use  . Smoking status: Current Every Day Smoker    Packs/day:  0.25    Years: 11.00    Pack years: 2.75    Types: Cigars  . Smokeless tobacco: Never Used  Substance Use Topics  . Alcohol use: No  . Drug use: Yes    Types: Marijuana    Comment: Stopped Mairjuana early March 2019     Allergies   Pollen extract   Review of Systems Review of Systems  Constitutional: Negative for fever.  Eyes: Negative for visual disturbance.  Respiratory: Negative for shortness of breath.   Cardiovascular: Negative for chest pain.  Neurological: Positive for facial asymmetry. Negative for weakness and numbness.  All other systems reviewed and are negative.    Physical Exam Updated Vital Signs BP 120/83 (BP Location: Right Arm)   Pulse 94   Temp 98 F (36.7 C) (Oral)   Resp 20   Ht 5\' 2"  (1.575 m)   Wt 85.7 kg   LMP 11/26/2017 (Exact Date)   SpO2 99%   BMI  34.57 kg/m   Physical Exam  Constitutional: She is oriented to person, place, and time. She appears well-developed and well-nourished.  HENT:  Head: Normocephalic and atraumatic.  Mouth/Throat: Oropharynx is clear and moist and mucous membranes are normal.  Eyes: Pupils are equal, round, and reactive to light. Conjunctivae, EOM and lids are normal. Right eye exhibits no discharge. Left eye exhibits no discharge. No scleral icterus.  Neck: Full passive range of motion without pain.  Cardiovascular: Normal rate, regular rhythm, normal heart sounds and normal pulses. Exam reveals no gallop and no friction rub.  No murmur heard. Pulses:      Radial pulses are 2+ on the right side, and 2+ on the left side.  Pulmonary/Chest: Effort normal and breath sounds normal.  Lungs clear to auscultation bilaterally.  Symmetric chest rise.  No wheezing, rales, rhonchi.  Abdominal: There is no tenderness. There is no rigidity and no guarding.  Gravid abdomen.  No tenderness palpation.  Musculoskeletal: Normal range of motion.  Neurological: She is alert and oriented to person, place, and time.  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  Right-sided cranial nerves intact without difficulty. Follows commands, Moves all extremities  5/5 strength to BUE and BLE  Sensation intact throughout all major nerve distributions Normal finger to nose. No gait abnormalities  No slurred speech.   Skin: Skin is warm and dry. Capillary refill takes less than 2 seconds.  Psychiatric: She has a normal mood and affect. Her speech is normal and behavior is normal.  Nursing note and vitals reviewed.    ED Treatments / Results  Labs (all labs ordered are listed, but only abnormal results are displayed) Labs Reviewed - No data to display  EKG None  Radiology No results found.  Procedures Procedures (including critical care time)  Medications Ordered in ED Medications - No data to  display   Initial Impression / Assessment and Plan / ED Course  I have reviewed the triage vital signs and the nursing notes.  Pertinent labs & imaging results that were available during my care of the patient were reviewed by me and considered in my medical decision making (see chart for details).     32 year old female who is currently [redacted] weeks pregnant who presents for evaluation of left-sided facial droop x3 days.  No vision changes, numbness/weakness of arms or legs, fevers.  Called OB/GYN who prompted to come to the emergency department. Patient is afebrile, non-toxic appearing, sitting comfortably on examination table. Vital signs  reviewed and stable.  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  No neuro deficits noted to bilateral upper and lower extremities.  Normal coordination.  History/physical exam is consistent with Bell's palsy.  History/physical exam is not concerning for CVA.  Discussed treatment options with patient.  Given that she is currently [redacted] weeks pregnant, do not feel that a course of prednisone and Valtrex is advised given possible complications.  Will plan to give Lacri-Lube before eye discomfort. Discussed patient with Dr. Jacqulyn Bath who independently evaluated the patient.  Patient instructed on supportive at home therapies.  Instructed patient to follow-up with her OB/GYN in the next 3 days. Patient had ample opportunity for questions and discussion. All patient's questions were answered with full understanding. Strict return precautions discussed. Patient expresses understanding and agreement to plan.   Final Clinical Impressions(s) / ED Diagnoses   Final diagnoses:  Bell's palsy    ED Discharge Orders         Ordered    artificial tears (LACRILUBE) OINT ophthalmic ointment  3 times daily     07/29/18 1732    Eye Patch MISC  Status:  Discontinued     07/29/18 1733    Eye Patch MISC     07/29/18 1734            Maxwell Caul, PA-C 07/29/18 1741    Maia Plan, MD 07/30/18 [phone]

## 2018-07-29 NOTE — ED Triage Notes (Signed)
Pt states that she was eating 3 days ago and noticed that she started feeling numb in the left side of her face. Pt is also [redacted] weeks pregnant, denies any pregnancy symptoms at this time. Pt denies any vision changes, denies any BP complications in pregnancy.

## 2018-07-29 NOTE — Telephone Encounter (Signed)
Pt called and left a message at 140pm that the left side of her face and body had been numb, called pt back at 210pm and advised her to go to [facility] to be seen as soon as possible to rule out a stroke. Pt verbalized understanding and stated she was on her way to the ED.

## 2018-07-29 NOTE — Discharge Instructions (Signed)
Use the artificial tears to help with eye dryness and irritation.  You can take the prescription for the patch to the medical supply store get a patch.  Use it on your left eye, particularly when sleeping to help with discomfort.  Follow-up with your OB/GYN on Tuesday.  Return to emergency department for any numbness of your arms or legs, difficulty walking, worsening symptoms, fever or any other worsening concerning symptoms.

## 2018-08-09 ENCOUNTER — Ambulatory Visit (INDEPENDENT_AMBULATORY_CARE_PROVIDER_SITE_OTHER): Payer: Medicaid Other | Admitting: Student

## 2018-08-09 DIAGNOSIS — Z3480 Encounter for supervision of other normal pregnancy, unspecified trimester: Secondary | ICD-10-CM

## 2018-08-09 DIAGNOSIS — Z3483 Encounter for supervision of other normal pregnancy, third trimester: Secondary | ICD-10-CM

## 2018-08-09 NOTE — Patient Instructions (Signed)
Contraception Choices Contraception, also called birth control, refers to methods or devices that prevent pregnancy. Hormonal methods Contraceptive implant A contraceptive implant is a thin, plastic tube that contains a hormone. It is inserted into the upper part of the arm. It can remain in place for up to 3 years. Progestin-only injections Progestin-only injections are injections of progestin, a synthetic form of the hormone progesterone. They are given every 3 months by a health care provider. Birth control pills Birth control pills are pills that contain hormones that prevent pregnancy. They must be taken once a day, preferably at the same time each day. Birth control patch The birth control patch contains hormones that prevent pregnancy. It is placed on the skin and must be changed once a week for three weeks and removed on the fourth week. A prescription is needed to use this method of contraception. Vaginal ring A vaginal ring contains hormones that prevent pregnancy. It is placed in the vagina for three weeks and removed on the fourth week. After that, the process is repeated with a new ring. A prescription is needed to use this method of contraception. Emergency contraceptive Emergency contraceptives prevent pregnancy after unprotected sex. They come in pill form and can be taken up to 5 days after sex. They work best the sooner they are taken after having sex. Most emergency contraceptives are available without a prescription. This method should not be used as your only form of birth control. Barrier methods Female condom A female condom is a thin sheath that is worn over the penis during sex. Condoms keep sperm from going inside a woman's body. They can be used with a spermicide to increase their effectiveness. They should be disposed after a single use. Female condom A female condom is a soft, loose-fitting sheath that is put into the vagina before sex. The condom keeps sperm from going  inside a woman's body. They should be disposed after a single use. Diaphragm A diaphragm is a soft, dome-shaped barrier. It is inserted into the vagina before sex, along with a spermicide. The diaphragm blocks sperm from entering the uterus, and the spermicide kills sperm. A diaphragm should be left in the vagina for 6-8 hours after sex and removed within 24 hours. A diaphragm is prescribed and fitted by a health care provider. A diaphragm should be replaced every 1-2 years, after giving birth, after gaining more than 15 lb (6.8 kg), and after pelvic surgery. Cervical cap A cervical cap is a round, soft latex or plastic cup that fits over the cervix. It is inserted into the vagina before sex, along with spermicide. It blocks sperm from entering the uterus. The cap should be left in place for 6-8 hours after sex and removed within 48 hours. A cervical cap must be prescribed and fitted by a health care provider. It should be replaced every 2 years. Sponge A sponge is a soft, circular piece of polyurethane foam with spermicide on it. The sponge helps block sperm from entering the uterus, and the spermicide kills sperm. To use it, you make it wet and then insert it into the vagina. It should be inserted before sex, left in for at least 6 hours after sex, and removed and thrown away within 30 hours. Spermicides Spermicides are chemicals that kill or block sperm from entering the cervix and uterus. They can come as a cream, jelly, suppository, foam, or tablet. A spermicide should be inserted into the vagina with an applicator at least 10-15 minutes before   sex to allow time for it to work. The process must be repeated every time you have sex. Spermicides do not require a prescription. Intrauterine contraception Intrauterine device (IUD) An IUD is a T-shaped device that is put in a woman's uterus. There are two types:  Hormone IUD.This type contains progestin, a synthetic form of the hormone progesterone. This  type can stay in place for 3-5 years.  Copper IUD.This type is wrapped in copper wire. It can stay in place for 10 years.  Permanent methods of contraception Female tubal ligation In this method, a woman's fallopian tubes are sealed, tied, or blocked during surgery to prevent eggs from traveling to the uterus. Hysteroscopic sterilization In this method, a small, flexible insert is placed into each fallopian tube. The inserts cause scar tissue to form in the fallopian tubes and block them, so sperm cannot reach an egg. The procedure takes about 3 months to be effective. Another form of birth control must be used during those 3 months. Female sterilization This is a procedure to tie off the tubes that carry sperm (vasectomy). After the procedure, the man can still ejaculate fluid (semen). Natural planning methods Natural family planning In this method, a couple does not have sex on days when the woman could become pregnant. Calendar method This means keeping track of the length of each menstrual cycle, identifying the days when pregnancy can happen, and not having sex on those days. Ovulation method In this method, a couple avoids sex during ovulation. Symptothermal method This method involves not having sex during ovulation. The woman typically checks for ovulation by watching changes in her temperature and in the consistency of cervical mucus. Post-ovulation method In this method, a couple waits to have sex until after ovulation. Summary  Contraception, also called birth control, means methods or devices that prevent pregnancy.  Hormonal methods of contraception include implants, injections, pills, patches, vaginal rings, and emergency contraceptives.  Barrier methods of contraception can include female condoms, female condoms, diaphragms, cervical caps, sponges, and spermicides.  There are two types of IUDs (intrauterine devices). An IUD can be put in a woman's uterus to prevent pregnancy  for 3-5 years.  Permanent sterilization can be done through a procedure for males, females, or both.  Natural family planning methods involve not having sex on days when the woman could become pregnant. This information is not intended to replace advice given to you by your health care provider. Make sure you discuss any questions you have with your health care provider. Document Released: 11/16/2005 Document Revised: 12/19/2016 Document Reviewed: 12/19/2016 Elsevier Interactive Patient Education  2018 Elsevier Inc.  

## 2018-08-09 NOTE — Progress Notes (Signed)
   PRENATAL VISIT NOTE  Subjective:  Sabrina Bray is a 32 y.o. [id number] at [redacted]w[redacted]d being seen today for ongoing prenatal care.  She is currently monitored for the following issues for this low-risk pregnancy and has Encounter for supervision of normal pregnancy in multigravida; Previous cesarean section; and Short interval between pregnancies affecting pregnancy in second trimester, antepartum on their problem list.  Patient reports no complaints. She was recently diagnosed with Bells Palsy. It is still bothering her but not impairing her ability to eat or talk.  Contractions: Irritability. Vag. Bleeding: None.  Movement: Present. Denies leaking of fluid.   The following portions of the patient's history were reviewed and updated as appropriate: allergies, current medications, past family history, past medical history, past social history, past surgical history and problem list. Problem list updated.  Objective:   Vitals:   08/09/18 1016  BP: 114/73  Pulse: 89  Weight: 189 lb 12.8 oz (86.1 kg)    Fetal Status: Fetal Heart Rate (bpm): 132 Fundal Height: 35 cm Movement: Present     General:  Alert, oriented and cooperative. Patient is in no acute distress.  Skin: Skin is warm and dry. No rash noted.   Cardiovascular: Normal heart rate noted  Respiratory: Normal respiratory effort, no problems with respiration noted  Abdomen: Soft, gravid, appropriate for gestational age.  Pain/Pressure: Present     Pelvic: Cervical exam deferred        Extremities: Normal range of motion.  Edema: None  Mental Status: Normal mood and affect. Normal behavior. Normal judgment and thought content.   Assessment and Plan:  Pregnancy: [id number] at [redacted]w[redacted]d  1. Encounter for supervision of normal pregnancy in multigravida -Patient does not want BTL; interested in patch.  -Confirmed circumcision out patient.  -Declined FUP [place]  Preterm labor symptoms and general obstetric precautions including but not limited to  vaginal bleeding, contractions, leaking of fluid and fetal movement were reviewed in detail with the patient. Please refer to After Visit Summary for other counseling recommendations.  Return in about 1 week (around 08/16/2018), or lrob.  No future appointments.  Marylene Land, CNM

## 2018-08-16 ENCOUNTER — Encounter: Payer: Self-pay | Admitting: Family Medicine

## 2018-08-16 ENCOUNTER — Other Ambulatory Visit (HOSPITAL_COMMUNITY)
Admission: RE | Admit: 2018-08-16 | Discharge: 2018-08-16 | Disposition: A | Discharge status: Home / Self Care | Payer: Medicaid Other | Source: Ambulatory Visit | Attending: Student | Admitting: Student

## 2018-08-16 ENCOUNTER — Ambulatory Visit (INDEPENDENT_AMBULATORY_CARE_PROVIDER_SITE_OTHER): Payer: Medicaid Other | Admitting: Student

## 2018-08-16 VITALS — BP 121/76 | HR 92 | Wt 190.4 lb

## 2018-08-16 DIAGNOSIS — Z3403 Encounter for supervision of normal first pregnancy, third trimester: Secondary | ICD-10-CM | POA: Insufficient documentation

## 2018-08-16 DIAGNOSIS — Z3480 Encounter for supervision of other normal pregnancy, unspecified trimester: Secondary | ICD-10-CM | POA: Diagnosis present

## 2018-08-16 NOTE — Progress Notes (Signed)
   PRENATAL VISIT NOTE  Subjective:  Sabrina Bray is a 32 y.o. [id number] at 3169w4d being seen today for ongoing prenatal care.  She is currently monitored for the following issues for this low-risk pregnancy and has Encounter for supervision of normal pregnancy in multigravida; Previous cesarean section; and Short interval between pregnancies affecting pregnancy in second trimester, antepartum on their problem list.  Patient reports no complaints.  Contractions: Irritability. Vag. Bleeding: None.  Movement: Present. Denies leaking of fluid.   The following portions of the patient's history were reviewed and updated as appropriate: allergies, current medications, past family history, past medical history, past social history, past surgical history and problem list. Problem list updated.  Objective:   Vitals:   08/16/18 1121  BP: 121/76  Pulse: 92  Weight: 190 lb 6.4 oz (86.4 kg)    Fetal Status: Fetal Heart Rate (bpm): 134   Movement: Present     General:  Alert, oriented and cooperative. Patient is in no acute distress.  Skin: Skin is warm and dry. No rash noted.   Cardiovascular: Normal heart rate noted  Respiratory: Normal respiratory effort, no problems with respiration noted  Abdomen: Soft, gravid, appropriate for gestational age.  Pain/Pressure: Present     Pelvic: Cervical exam deferred        Extremities: Normal range of motion.  Edema: Trace  Mental Status: Normal mood and affect. Normal behavior. Normal judgment and thought content.   Assessment and Plan:  Pregnancy: [id number] at 7469w4d  1. Encounter for supervision of normal pregnancy in multigravida -Discussed birth control again; she wants to try the patch.  - GC[facility]Chlam[facility] amp [facility] - Culture, beta strep (group b only)  Preterm labor symptoms and general obstetric precautions including but not limited to vaginal bleeding, contractions, leaking of fluid and fetal movement were reviewed in detail  with the patient. Please refer to After Visit Summary for other counseling recommendations.  Return in about 1 week (around 08/23/2018), or needs note its ok to work.  Future Appointments  Date Time Provider Department Center  08/23/2018 11:15 AM Marylene LandKooistra, Arshad Oberholzer [place], CNM [facility]    Charlesetta GaribaldiKathryn [place], [place]

## 2018-08-16 NOTE — Patient Instructions (Signed)
Cesarean Delivery Cesarean birth, or cesarean delivery, is the surgical delivery of a baby through an incision in the abdomen and the uterus. This may be referred to as a C-section. This procedure may be scheduled ahead of time, or it may be done in an emergency situation. Tell a health care provider about:  Any allergies you have.  All medicines you are taking, including vitamins, herbs, eye drops, creams, and over-the-counter medicines.  Any problems you or family members have had with anesthetic medicines.  Any blood disorders you have.  Any surgeries you have had.  Any medical conditions you have.  Whether you or any members of your family have a history of deep vein thrombosis (DVT) or pulmonary embolism (PE). What are the risks? Generally, this is a safe procedure. However, problems may occur, including:  Infection.  Bleeding.  Allergic reactions to medicines.  Damage to other structures or organs.  Blood clots.  Injury to your baby.  What happens before the procedure?  Follow instructions from your health care provider about eating or drinking restrictions.  Follow instructions from your health care provider about bathing before your procedure to help reduce your risk of infection.  If you know that you are going to have a cesarean delivery, do not shave your pubic area. Shaving before the procedure may increase your risk of infection.  Ask your health care provider about: ? Changing or stopping your regular medicines. This is especially important if you are taking diabetes medicines or blood thinners. ? Your pain management plan. This is especially important if you plan to breastfeed your baby. ? How long you will be in the hospital after the procedure. ? Any concerns you may have about receiving blood products if you need them during the procedure. ? Cord blood banking, if you plan to collect your baby's umbilical cord blood.  You may also want to ask your  health care provider: ? Whether you will be able to hold or breastfeed your baby while you are still in the operating room. ? Whether your baby can stay with you immediately after the procedure and during your recovery. ? Whether a family member or a person of your choice can go with you into the operating room and stay with you during the procedure, immediately after the procedure, and during your recovery.  Plan to have someone drive you home when you are discharged from the hospital. What happens during the procedure?  Fetal monitors will be placed on your abdomen to monitor your heart rate and your baby's heart rate.  Depending on the reason for your cesarean delivery, you may have a physical exam or additional testing, such as an ultrasound.  An IV tube will be inserted into one of your veins.  You may have your blood or urine tested.  You will be given antibiotic medicine to help prevent infection.  You may be given a special warming gown to wear to keep your temperature stable.  Hair may be removed from your pubic area.  The skin of your pubic area and lower abdomen will be cleaned with a germ-killing solution (antiseptic).  A catheter may be inserted into your bladder through your urethra. This drains your urine during the procedure.  You may be given one or more of the following: ? A medicine to numb the area (local anesthetic). ? A medicine to make you fall asleep (general anesthetic). ? A medicine (regional anesthetic) that is injected into your back or through a small   thin tube placed in your back (spinal anesthetic or epidural anesthetic). This numbs everything below the injection site and allows you to stay awake during your procedure. If this makes you feel nauseous, tell your health care provider. Medicines will be available to help reduce any nausea you may feel.  An incision will be made in your abdomen, and then in your uterus.  If you are awake during your  procedure, you may feel tugging and pulling in your abdomen, but you should not feel pain. If you feel pain, tell your health care provider immediately.  Your baby will be removed from your uterus. You may feel more pressure or pushing while this happens.  Immediately after birth, your baby will be dried and kept warm. You may be able to hold and breastfeed your baby. The umbilical cord may be clamped and cut during this time.  Your placenta will be removed from your uterus.  Your incisions will be closed with stitches (sutures). Staples, skin glue, or adhesive strips may also be applied to the incision in your abdomen.  Bandages (dressings) will be placed over the incision in your abdomen. The procedure may vary among health care providers and hospitals. What happens after the procedure?  Your blood pressure, heart rate, breathing rate, and blood oxygen level will be monitored often until the medicines you were given have worn off.  You may continue to receive fluids and medicines through an IV tube.  You will have some pain. Medicines will be available to help control your pain.  To help prevent blood clots: ? You may be given medicines. ? You may have to wear compression stockings or devices. ? You will be encouraged to walk around when you are able.  Hospital staff will encourage and support bonding with your baby. Your hospital may allow you and your baby to stay in the same room (rooming in) during your hospital stay to encourage successful breastfeeding.  You may be encouraged to cough and breathe deeply often. This helps to prevent lung problems.  If you have a catheter draining your urine, it will be removed as soon as possible after your procedure. This information is not intended to replace advice given to you by your health care provider. Make sure you discuss any questions you have with your health care provider. Document Released: 11/16/2005 Document Revised: 04/23/2016  Document Reviewed: 08/27/2015 Elsevier Interactive Patient Education  2018 Elsevier Inc.  

## 2018-08-17 LAB — GC/CHLAMYDIA PROBE AMP (~~LOC~~) NOT AT ARMC
Chlamydia: NEGATIVE
NEISSERIA GONORRHEA: NEGATIVE

## 2018-08-18 ENCOUNTER — Encounter (HOSPITAL_COMMUNITY): Payer: Self-pay

## 2018-08-19 LAB — CULTURE, BETA STREP (GROUP B ONLY): Strep Gp B Culture: NEGATIVE

## 2018-08-23 ENCOUNTER — Encounter: Payer: Self-pay | Admitting: Student

## 2018-08-23 ENCOUNTER — Ambulatory Visit (INDEPENDENT_AMBULATORY_CARE_PROVIDER_SITE_OTHER): Payer: Medicaid Other | Admitting: Student

## 2018-08-23 DIAGNOSIS — Z3480 Encounter for supervision of other normal pregnancy, unspecified trimester: Secondary | ICD-10-CM

## 2018-08-23 NOTE — Patient Instructions (Signed)

## 2018-08-23 NOTE — Progress Notes (Signed)
   PRENATAL VISIT NOTE  Subjective:  Sabrina Bray is a 32 y.o. [id number] at 6779w4d being seen today for ongoing prenatal care.  She is currently monitored for the following issues for this low-risk pregnancy and has Encounter for supervision of normal pregnancy in multigravida; Previous cesarean section; and Short interval between pregnancies affecting pregnancy in second trimester, antepartum on their problem list.  Patient reports no complaints.  Contractions: Irregular. Vag. Bleeding: None.  Movement: Present. Denies leaking of fluid.   The following portions of the patient's history were reviewed and updated as appropriate: allergies, current medications, past family history, past medical history, past social history, past surgical history and problem list. Problem list updated.  Objective:   Vitals:   08/23/18 1128  BP: 135/74  Pulse: 90  Weight: 189 lb (85.7 kg)    Fetal Status: Fetal Heart Rate (bpm): 142 Fundal Height: 37 cm Movement: Present     General:  Alert, oriented and cooperative. Patient is in no acute distress.  Skin: Skin is warm and dry. No rash noted.   Cardiovascular: Normal heart rate noted  Respiratory: Normal respiratory effort, no problems with respiration noted  Abdomen: Soft, gravid, appropriate for gestational age.  Pain/Pressure: Present     Pelvic: Cervical exam deferred        Extremities: Normal range of motion.  Edema: Trace  Mental Status: Normal mood and affect. Normal behavior. Normal judgment and thought content.   Assessment and Plan:  Pregnancy: [id number] at 5979w4d  1. Encounter for supervision of normal pregnancy in multigravida -Doing well. Patient does not want to have another appointment until her c-section next Friday. I encouraged her to make an appt for early next week, regardless of her planned delivery date. Patient agrees to do so.   Term labor symptoms and general obstetric precautions including but not limited to vaginal bleeding,  contractions, leaking of fluid and fetal movement were reviewed in detail with the patient. Please refer to After Visit Summary for other counseling recommendations.  Return LROB.  Future Appointments  Date Time Provider Department Center  08/30/2018 11:15 AM Kendell BaneLeftwich-Kirby, Lisa A, CNM [facility]  09/01/2018 10:30 AM [facility] PAT 5 [facility] None    Sabrina Bray, [place]

## 2018-08-26 NOTE — Patient Instructions (Signed)
Sabrina Bray  08/26/2018   Your procedure is scheduled on:  09/02/2018  Enter through the Main Entrance of [facility] at 1030 AM.  Pick up the phone at the desk and dial [phone]  Call this number if you have problems the morning of surgery:[phone]  Remember:   Do not eat food:(After Midnight) Desps de medianoche.  Do not drink clear liquids: (After Midnight) Desps de medianoche.  Take these medicines the morning of surgery with A SIP OF WATER: none   Do not wear jewelry, make-up or nail polish.  Do not wear lotions, powders, or perfumes. Do not wear deodorant.  Do not shave 48 hours prior to surgery.  Do not bring valuables to the hospital.  [facility] is not   responsible for any belongings or valuables brought to the hospital.  Contacts, dentures or bridgework may not be worn into surgery.  Leave suitcase in the car. After surgery it may be brought to your room.  For patients admitted to the hospital, checkout time is 11:00 AM the day of              discharge.    N/A   Please read over the following fact sheets that you were given:   Surgical Site Infection Prevention

## 2018-08-30 ENCOUNTER — Ambulatory Visit (INDEPENDENT_AMBULATORY_CARE_PROVIDER_SITE_OTHER): Payer: Medicaid Other | Admitting: Advanced Practice Midwife

## 2018-08-30 VITALS — BP 136/86 | HR 89 | Wt 190.6 lb

## 2018-08-30 DIAGNOSIS — O479 False labor, unspecified: Secondary | ICD-10-CM

## 2018-08-30 DIAGNOSIS — Z98891 History of uterine scar from previous surgery: Secondary | ICD-10-CM

## 2018-08-30 DIAGNOSIS — Z3480 Encounter for supervision of other normal pregnancy, unspecified trimester: Secondary | ICD-10-CM

## 2018-08-30 NOTE — Progress Notes (Signed)
   PRENATAL VISIT NOTE  Subjective:  Sabrina Bray is a 32 y.o. [id number] at [redacted]w[redacted]d being seen today for ongoing prenatal care.  She is currently monitored for the following issues for this low-risk pregnancy and has Encounter for supervision of normal pregnancy in multigravida; Previous cesarean section; and Short interval between pregnancies affecting pregnancy in second trimester, antepartum on their problem list.  Patient reports contractions since 9 am today.  Contractions: Irregular. Vag. Bleeding: None.  Movement: Present. Denies leaking of fluid.   The following portions of the patient's history were reviewed and updated as appropriate: allergies, current medications, past family history, past medical history, past social history, past surgical history and problem list. Problem list updated.  Objective:   Vitals:   08/30/18 1147  BP: 136/86  Pulse: 89  Weight: 86.5 kg    Fetal Status: Fetal Heart Rate (bpm): 140 Fundal Height: 38 cm Movement: Present  Presentation: Vertex  General:  Alert, oriented and cooperative. Patient is in no acute distress.  Skin: Skin is warm and dry. No rash noted.   Cardiovascular: Normal heart rate noted  Respiratory: Normal respiratory effort, no problems with respiration noted  Abdomen: Soft, gravid, appropriate for gestational age.  Pain/Pressure: Present     Pelvic: Cervical exam performed Dilation: Closed Effacement (%): 70 Station: -2  Extremities: Normal range of motion.  Edema: Trace  Mental Status: Normal mood and affect. Normal behavior. Normal judgment and thought content.   Assessment and Plan:  Pregnancy: [id number] at [redacted]w[redacted]d  1. Encounter for supervision of normal pregnancy in multigravida --Doing well, irregular cramping, see below. --Pt has pre-op and c/s as next appts this week.  2. Previous cesarean section --x3, repeat scheduled on Friday, 09/02/18  3. Braxton Hicks contractions -No evidence of labor today with closed cervix.   Reviewed signs of labor/warning signs/reasons to come to [facility].   Term labor symptoms and general obstetric precautions including but not limited to vaginal bleeding, contractions, leaking of fluid and fetal movement were reviewed in detail with the patient. Please refer to After Visit Summary for other counseling recommendations.  No follow-ups on file.  Future Appointments  Date Time Provider Department Center  09/01/2018 10:30 AM [facility] PAT 5 [facility] None    Sharen Counter, CNM

## 2018-08-30 NOTE — Patient Instructions (Signed)

## 2018-08-30 NOTE — Progress Notes (Signed)
  Pt states has been having back to back contractions since yesterday.

## 2018-09-01 ENCOUNTER — Encounter (HOSPITAL_COMMUNITY)
Admission: RE | Admit: 2018-09-01 | Discharge: 2018-09-01 | Disposition: A | Discharge status: Home / Self Care | Payer: Medicaid Other | Source: Ambulatory Visit | Attending: Obstetrics & Gynecology | Admitting: Obstetrics & Gynecology

## 2018-09-01 DIAGNOSIS — Z98891 History of uterine scar from previous surgery: Secondary | ICD-10-CM

## 2018-09-01 DIAGNOSIS — Z01818 Encounter for other preprocedural examination: Secondary | ICD-10-CM

## 2018-09-01 LAB — CBC
HEMATOCRIT: 32.5 % — AB (ref 36.0–46.0)
Hemoglobin: 10.9 g/dL — ABNORMAL LOW (ref 12.0–15.0)
MCH: 28.2 pg (ref 26.0–34.0)
MCHC: 33.5 g/dL (ref 30.0–36.0)
MCV: 84 fL (ref 78.0–100.0)
Platelets: 175 10*3/uL (ref 150–400)
RBC: 3.87 MIL/uL (ref 3.87–5.11)
RDW: 13.6 % (ref 11.5–15.5)
WBC: 9.2 10*3/uL (ref 4.0–10.5)

## 2018-09-02 ENCOUNTER — Inpatient Hospital Stay (HOSPITAL_COMMUNITY)
Admission: RE | Admit: 2018-09-02 | Discharge: 2018-09-05 | DRG: 788 | Disposition: A | Discharge status: Home / Self Care | Payer: Medicaid Other | Attending: Obstetrics & Gynecology | Admitting: Obstetrics & Gynecology

## 2018-09-02 ENCOUNTER — Other Ambulatory Visit: Payer: Self-pay

## 2018-09-02 ENCOUNTER — Inpatient Hospital Stay (HOSPITAL_COMMUNITY): Payer: Medicaid Other | Admitting: Anesthesiology

## 2018-09-02 ENCOUNTER — Encounter (HOSPITAL_COMMUNITY): Payer: Self-pay | Admitting: Anesthesiology

## 2018-09-02 ENCOUNTER — Encounter (HOSPITAL_COMMUNITY)
Admission: RE | Disposition: A | Discharge status: Home / Self Care | Payer: Self-pay | Source: Home / Self Care | Attending: Obstetrics & Gynecology

## 2018-09-02 DIAGNOSIS — O134 Gestational [pregnancy-induced] hypertension without significant proteinuria, complicating childbirth: Secondary | ICD-10-CM | POA: Diagnosis present

## 2018-09-02 DIAGNOSIS — O34211 Maternal care for low transverse scar from previous cesarean delivery: Principal | ICD-10-CM | POA: Diagnosis present

## 2018-09-02 DIAGNOSIS — O99334 Smoking (tobacco) complicating childbirth: Secondary | ICD-10-CM | POA: Diagnosis present

## 2018-09-02 DIAGNOSIS — O99214 Obesity complicating childbirth: Secondary | ICD-10-CM | POA: Diagnosis present

## 2018-09-02 DIAGNOSIS — O9902 Anemia complicating childbirth: Secondary | ICD-10-CM | POA: Diagnosis present

## 2018-09-02 DIAGNOSIS — F17210 Nicotine dependence, cigarettes, uncomplicated: Secondary | ICD-10-CM | POA: Diagnosis present

## 2018-09-02 DIAGNOSIS — E669 Obesity, unspecified: Secondary | ICD-10-CM | POA: Diagnosis present

## 2018-09-02 DIAGNOSIS — Z3480 Encounter for supervision of other normal pregnancy, unspecified trimester: Secondary | ICD-10-CM

## 2018-09-02 DIAGNOSIS — Z3A39 39 weeks gestation of pregnancy: Secondary | ICD-10-CM

## 2018-09-02 DIAGNOSIS — D649 Anemia, unspecified: Secondary | ICD-10-CM | POA: Diagnosis present

## 2018-09-02 DIAGNOSIS — Z98891 History of uterine scar from previous surgery: Secondary | ICD-10-CM

## 2018-09-02 LAB — PREPARE RBC (CROSSMATCH)

## 2018-09-02 LAB — RPR: RPR: NONREACTIVE

## 2018-09-02 SURGERY — Surgical Case
Anesthesia: Spinal

## 2018-09-02 MED ORDER — LACTATED RINGERS IV SOLN
INTRAVENOUS | Status: DC
  Administered 2018-09-03: 01:00:00 via INTRAVENOUS

## 2018-09-02 MED ORDER — FENTANYL CITRATE (PF) 100 MCG/2ML IJ SOLN: 25.0000 ug | INTRAMUSCULAR | Status: DC | PRN

## 2018-09-02 MED ORDER — SIMETHICONE 80 MG PO CHEW: 80.0000 mg | CHEWABLE_TABLET | ORAL | Status: DC | PRN

## 2018-09-02 MED ORDER — ENOXAPARIN SODIUM 40 MG/0.4ML ~~LOC~~ SOLN
40.0000 mg | SUBCUTANEOUS | Status: DC
  Administered 2018-09-03 – 2018-09-05 (×3): 40 mg via SUBCUTANEOUS
  Filled 2018-09-02 (×3): qty 0.4

## 2018-09-02 MED ORDER — ACETAMINOPHEN 325 MG PO TABS: 650.0000 mg | ORAL_TABLET | ORAL | Status: DC | PRN

## 2018-09-02 MED ORDER — MORPHINE SULFATE (PF) 0.5 MG/ML IJ SOLN
INTRAMUSCULAR | Status: DC | PRN
  Administered 2018-09-02: .15 mg via INTRATHECAL

## 2018-09-02 MED ORDER — ZOLPIDEM TARTRATE 5 MG PO TABS: 5.0000 mg | ORAL_TABLET | Freq: Every evening | ORAL | Status: DC | PRN

## 2018-09-02 MED ORDER — ONDANSETRON HCL 4 MG/2ML IJ SOLN
INTRAMUSCULAR | Status: DC | PRN
  Administered 2018-09-02: 4 mg via INTRAVENOUS

## 2018-09-02 MED ORDER — MAGNESIUM HYDROXIDE 400 MG/5ML PO SUSP: 30.0000 mL | ORAL | Status: DC | PRN

## 2018-09-02 MED ORDER — SIMETHICONE 80 MG PO CHEW
80.0000 mg | CHEWABLE_TABLET | ORAL | Status: DC
  Administered 2018-09-02 – 2018-09-04 (×3): 80 mg via ORAL
  Filled 2018-09-02 (×3): qty 1

## 2018-09-02 MED ORDER — KETOROLAC TROMETHAMINE 30 MG/ML IJ SOLN: 30.0000 mg | Freq: Four times a day (QID) | INTRAMUSCULAR | Status: AC | PRN

## 2018-09-02 MED ORDER — CEFAZOLIN SODIUM-DEXTROSE 2-3 GM-%(50ML) IV SOLR
INTRAVENOUS | Status: DC | PRN
  Administered 2018-09-02: 2 g via INTRAVENOUS

## 2018-09-02 MED ORDER — SODIUM CHLORIDE 0.9 % IV SOLN
2.0000 g | INTRAVENOUS | Status: DC
  Filled 2018-09-02 (×2): qty 2

## 2018-09-02 MED ORDER — SCOPOLAMINE 1 MG/3DAYS TD PT72
1.0000 | MEDICATED_PATCH | Freq: Once | TRANSDERMAL | Status: DC
  Administered 2018-09-02: 1.5 mg via TRANSDERMAL
  Filled 2018-09-02: qty 1

## 2018-09-02 MED ORDER — OXYTOCIN 10 UNIT/ML IJ SOLN
INTRAMUSCULAR | Status: AC
  Filled 2018-09-02: qty 4

## 2018-09-02 MED ORDER — OXYTOCIN 40 UNITS IN LACTATED RINGERS INFUSION - SIMPLE MED: 2.5000 [IU]/h | INTRAVENOUS | Status: AC

## 2018-09-02 MED ORDER — KETOROLAC TROMETHAMINE 30 MG/ML IJ SOLN
30.0000 mg | Freq: Four times a day (QID) | INTRAMUSCULAR | Status: AC | PRN
  Administered 2018-09-02: 30 mg via INTRAMUSCULAR

## 2018-09-02 MED ORDER — DIPHENHYDRAMINE HCL 25 MG PO CAPS: 25.0000 mg | ORAL_CAPSULE | Freq: Four times a day (QID) | ORAL | Status: DC | PRN

## 2018-09-02 MED ORDER — OXYTOCIN 10 UNIT/ML IJ SOLN
INTRAVENOUS | Status: DC | PRN
  Administered 2018-09-02: 40 [IU] via INTRAVENOUS

## 2018-09-02 MED ORDER — BUPIVACAINE HCL (PF) 0.5 % IJ SOLN
INTRAMUSCULAR | Status: AC
  Filled 2018-09-02: qty 30

## 2018-09-02 MED ORDER — DIPHENHYDRAMINE HCL 25 MG PO CAPS
25.0000 mg | ORAL_CAPSULE | ORAL | Status: DC | PRN
  Filled 2018-09-02: qty 1

## 2018-09-02 MED ORDER — NALBUPHINE HCL 10 MG/ML IJ SOLN: 5.0000 mg | INTRAMUSCULAR | Status: DC | PRN

## 2018-09-02 MED ORDER — SODIUM CHLORIDE 0.9% FLUSH: 3.0000 mL | INTRAVENOUS | Status: DC | PRN

## 2018-09-02 MED ORDER — PHENYLEPHRINE 8 MG IN D5W 100 ML (0.08MG/ML) PREMIX OPTIME
INJECTION | INTRAVENOUS | Status: DC | PRN
  Administered 2018-09-02: 60 ug/min via INTRAVENOUS

## 2018-09-02 MED ORDER — DIBUCAINE 1 % RE OINT: 1.0000 "application " | TOPICAL_OINTMENT | RECTAL | Status: DC | PRN

## 2018-09-02 MED ORDER — OXYCODONE-ACETAMINOPHEN 5-325 MG PO TABS
1.0000 | ORAL_TABLET | ORAL | Status: DC | PRN
  Administered 2018-09-03 – 2018-09-04 (×6): 1 via ORAL
  Filled 2018-09-02 (×5): qty 1

## 2018-09-02 MED ORDER — ONDANSETRON HCL 4 MG/2ML IJ SOLN
INTRAMUSCULAR | Status: AC
  Filled 2018-09-02: qty 2

## 2018-09-02 MED ORDER — BUPIVACAINE IN DEXTROSE 0.75-8.25 % IT SOLN
INTRATHECAL | Status: DC | PRN
  Administered 2018-09-02: 12.5 mg via INTRATHECAL

## 2018-09-02 MED ORDER — TRANEXAMIC ACID 1000 MG/10ML IV SOLN
1000.0000 mg | INTRAVENOUS | Status: AC
  Administered 2018-09-02: 1000 mg via INTRAVENOUS

## 2018-09-02 MED ORDER — ONDANSETRON HCL 4 MG/2ML IJ SOLN: 4.0000 mg | Freq: Three times a day (TID) | INTRAMUSCULAR | Status: DC | PRN

## 2018-09-02 MED ORDER — COCONUT OIL OIL: 1.0000 "application " | TOPICAL_OIL | Status: DC | PRN

## 2018-09-02 MED ORDER — IBUPROFEN 600 MG PO TABS
600.0000 mg | ORAL_TABLET | Freq: Four times a day (QID) | ORAL | Status: DC
  Administered 2018-09-02 – 2018-09-05 (×11): 600 mg via ORAL
  Filled 2018-09-02 (×11): qty 1

## 2018-09-02 MED ORDER — NALOXONE HCL 0.4 MG/ML IJ SOLN: 0.4000 mg | INTRAMUSCULAR | Status: DC | PRN

## 2018-09-02 MED ORDER — FENTANYL CITRATE (PF) 100 MCG/2ML IJ SOLN
INTRAMUSCULAR | Status: AC
  Filled 2018-09-02: qty 2

## 2018-09-02 MED ORDER — FERROUS SULFATE 325 (65 FE) MG PO TABS
325.0000 mg | ORAL_TABLET | Freq: Two times a day (BID) | ORAL | Status: DC
  Administered 2018-09-03 – 2018-09-05 (×5): 325 mg via ORAL
  Filled 2018-09-02 (×6): qty 1

## 2018-09-02 MED ORDER — SOD CITRATE-CITRIC ACID 500-334 MG/5ML PO SOLN
30.0000 mL | Freq: Once | ORAL | Status: DC
  Filled 2018-09-02: qty 15

## 2018-09-02 MED ORDER — PHENYLEPHRINE 8 MG IN D5W 100 ML (0.08MG/ML) PREMIX OPTIME
INJECTION | INTRAVENOUS | Status: AC
  Filled 2018-09-02: qty 100

## 2018-09-02 MED ORDER — SENNOSIDES-DOCUSATE SODIUM 8.6-50 MG PO TABS
2.0000 | ORAL_TABLET | ORAL | Status: DC
  Administered 2018-09-02 – 2018-09-04 (×3): 2 via ORAL
  Filled 2018-09-02 (×3): qty 2

## 2018-09-02 MED ORDER — WITCH HAZEL-GLYCERIN EX PADS: 1.0000 "application " | MEDICATED_PAD | CUTANEOUS | Status: DC | PRN

## 2018-09-02 MED ORDER — LACTATED RINGERS IV SOLN: INTRAVENOUS | Status: DC

## 2018-09-02 MED ORDER — MEASLES, MUMPS & RUBELLA VAC ~~LOC~~ INJ: 0.5000 mL | INJECTION | Freq: Once | SUBCUTANEOUS | Status: DC

## 2018-09-02 MED ORDER — MENTHOL 3 MG MT LOZG: 1.0000 | LOZENGE | OROMUCOSAL | Status: DC | PRN

## 2018-09-02 MED ORDER — PROMETHAZINE HCL 25 MG/ML IJ SOLN
6.2500 mg | Freq: Four times a day (QID) | INTRAMUSCULAR | Status: DC | PRN
  Administered 2018-09-02: 6.25 mg via INTRAVENOUS
  Filled 2018-09-02: qty 1

## 2018-09-02 MED ORDER — SOD CITRATE-CITRIC ACID 500-334 MG/5ML PO SOLN
30.0000 mL | ORAL | Status: AC
  Administered 2018-09-02: 30 mL via ORAL

## 2018-09-02 MED ORDER — DIPHENHYDRAMINE HCL 50 MG/ML IJ SOLN: 12.5000 mg | INTRAMUSCULAR | Status: DC | PRN

## 2018-09-02 MED ORDER — PRENATAL MULTIVITAMIN CH
1.0000 | ORAL_TABLET | Freq: Every day | ORAL | Status: DC
  Administered 2018-09-03 – 2018-09-05 (×3): 1 via ORAL
  Filled 2018-09-02 (×3): qty 1

## 2018-09-02 MED ORDER — LACTATED RINGERS IV SOLN
INTRAVENOUS | Status: DC
  Administered 2018-09-02 (×3): via INTRAVENOUS

## 2018-09-02 MED ORDER — NALOXONE HCL 4 MG/10ML IJ SOLN
1.0000 ug/kg/h | INTRAVENOUS | Status: DC | PRN
  Filled 2018-09-02: qty 5

## 2018-09-02 MED ORDER — MEPERIDINE HCL 25 MG/ML IJ SOLN: 6.2500 mg | INTRAMUSCULAR | Status: DC | PRN

## 2018-09-02 MED ORDER — NALBUPHINE HCL 10 MG/ML IJ SOLN: 5.0000 mg | Freq: Once | INTRAMUSCULAR | Status: DC | PRN

## 2018-09-02 MED ORDER — MORPHINE SULFATE (PF) 0.5 MG/ML IJ SOLN
INTRAMUSCULAR | Status: AC
  Filled 2018-09-02: qty 10

## 2018-09-02 MED ORDER — FENTANYL CITRATE (PF) 100 MCG/2ML IJ SOLN
INTRAMUSCULAR | Status: DC | PRN
  Administered 2018-09-02: 15 ug via INTRATHECAL

## 2018-09-02 MED ORDER — OXYCODONE-ACETAMINOPHEN 5-325 MG PO TABS
2.0000 | ORAL_TABLET | ORAL | Status: DC | PRN
  Administered 2018-09-04 – 2018-09-05 (×6): 2 via ORAL
  Filled 2018-09-02 (×7): qty 2

## 2018-09-02 MED ORDER — CEFAZOLIN SODIUM-DEXTROSE 2-4 GM/100ML-% IV SOLN
INTRAVENOUS | Status: AC
  Filled 2018-09-02: qty 100

## 2018-09-02 MED ORDER — TRANEXAMIC ACID 1000 MG/10ML IV SOLN
INTRAVENOUS | Status: AC
  Filled 2018-09-02: qty 10

## 2018-09-02 MED ORDER — TETANUS-DIPHTH-ACELL PERTUSSIS 5-2.5-18.5 LF-MCG/0.5 IM SUSP: 0.5000 mL | Freq: Once | INTRAMUSCULAR | Status: DC

## 2018-09-02 SURGICAL SUPPLY — 32 items
CHLORAPREP W/TINT 26ML (MISCELLANEOUS) ×2 IMPLANT
CLAMP CORD UMBIL (MISCELLANEOUS) IMPLANT
CLOTH BEACON ORANGE TIMEOUT ST (SAFETY) ×2 IMPLANT
DRSG OPSITE POSTOP 4X10 (GAUZE/BANDAGES/DRESSINGS) ×2 IMPLANT
DRSG OPSITE POSTOP 4X14 (GAUZE/BANDAGES/DRESSINGS) ×2 IMPLANT
ELECT REM PT RETURN 9FT ADLT (ELECTROSURGICAL) ×2
ELECTRODE REM PT RTRN 9FT ADLT (ELECTROSURGICAL) ×1 IMPLANT
EXTRACTOR VACUUM M CUP 4 TUBE (SUCTIONS) IMPLANT
GAUZE SPONGE 4X4 12PLY STRL (GAUZE/BANDAGES/DRESSINGS) ×4 IMPLANT
GLOVE BIOGEL PI IND STRL 7.0 (GLOVE) ×3 IMPLANT
GLOVE BIOGEL PI INDICATOR 7.0 (GLOVE) ×3
GLOVE ECLIPSE 7.0 STRL STRAW (GLOVE) ×2 IMPLANT
GOWN STRL REUS W/TWL LRG LVL3 (GOWN DISPOSABLE) ×4 IMPLANT
KIT ABG SYR 3ML LUER SLIP (SYRINGE) IMPLANT
NEEDLE HYPO 22GX1.5 SAFETY (NEEDLE) ×4 IMPLANT
NEEDLE HYPO 25X5/8 SAFETYGLIDE (NEEDLE) ×2 IMPLANT
NS IRRIG 1000ML POUR BTL (IV SOLUTION) ×2 IMPLANT
PACK C SECTION WH (CUSTOM PROCEDURE TRAY) ×2 IMPLANT
PAD ABD 7.5X8 STRL (GAUZE/BANDAGES/DRESSINGS) ×4 IMPLANT
PAD ABD DERMACEA PRESS 5X9 (GAUZE/BANDAGES/DRESSINGS) ×2 IMPLANT
PAD OB MATERNITY 4.3X12.25 (PERSONAL CARE ITEMS) ×2 IMPLANT
PENCIL SMOKE EVAC W/HOLSTER (ELECTROSURGICAL) ×2 IMPLANT
RTRCTR C-SECT PINK 25CM LRG (MISCELLANEOUS) IMPLANT
SPONGE LAP 18X18 RF (DISPOSABLE) ×6 IMPLANT
SUT PDS AB 0 CTX 36 PDP370T (SUTURE) IMPLANT
SUT PLAIN 2 0 XLH (SUTURE) IMPLANT
SUT VIC AB 0 CTX 36 (SUTURE) ×2
SUT VIC AB 0 CTX36XBRD ANBCTRL (SUTURE) ×2 IMPLANT
SUT VIC AB 4-0 KS 27 (SUTURE) ×2 IMPLANT
SYR CONTROL 10ML LL (SYRINGE) ×4 IMPLANT
TOWEL OR 17X24 6PK STRL BLUE (TOWEL DISPOSABLE) ×2 IMPLANT
TRAY FOLEY W/BAG SLVR 14FR LF (SET/KITS/TRAYS/PACK) ×2 IMPLANT

## 2018-09-02 NOTE — Lactation Note (Signed)
This note was copied from a baby's chart. Lactation Consultation Note  Patient Name: Sabrina Bray [id number] Date: 09/02/2018 Reason for consult: Initial assessment;Term  6 hours old FT female who is being exclusively formula feeding by his mother at this point, however she came as BF. Spoke to her RN, Alvino Chapel and she's been trying to assist with BF; the reason why mom has not BF yet has nothing to do with THC, my her willingness to BF. Mom packing baby's items when entering the room, but didn't seem too interested in [facility] consultation. She voiced she only BF her first child for 4-5 weeks and doesn't have a pump at home. Offered the alternative to pump and bottle feed; she saw the DEBP in the room and inquired about it, but when [facility] told mom that is for hospital use only and not to take home; mom didn't have any interest in pumping.  She demonstrated hand expression to LC but no colostrum was seen yet. Reviewed engorgement prevention and treatment since mom is only bottle feeding so far; and encouraged her to put baby to the breast or to pump when she's ready. BF brochure, BF resources and feeding diary were reviewed, mom is aware of LC services and will call PRN.  Maternal Data Formula Feeding for Exclusion: No Has patient been taught Hand Expression?: Yes Does the patient have breastfeeding experience prior to this delivery?: Yes  Feeding Feeding Type: Bottle Fed - Formula Nipple Type: Slow - flow  Interventions Interventions: Breast feeding basics reviewed  Lactation Tools Discussed/Used WIC Program: No   Consult Status Consult Status: PRN Follow-up type: In-patient    Mora Pedraza Venetia Constable 09/02/2018, 7:16 PM

## 2018-09-02 NOTE — H&P (Signed)
Obstetric Preoperative History and Physical  Sabrina Bray is a 32 y.o. [id number] with IUP at [redacted]w[redacted]d presenting for scheduled repeat cesarean section.  Reports good fetal movement, no bleeding, no contractions, no leaking of fluid.  No acute preoperative concerns.    Cesarean Section Indication: Previous cesarean section x 3  Prenatal Course Pregnancy complications or risks: Patient Active Problem List   Diagnosis Date Noted  . Short interval between pregnancies affecting pregnancy in second trimester, antepartum 03/22/2018  . Encounter for supervision of normal pregnancy in multigravida 06/22/2016  . Previous cesarean section 06/22/2016   Clinic  [facility] Prenatal Labs  Dating  LMP Blood type: O/Positive/-- (03/27 1212)   Genetic Screen Declined SMN1 copy number: 3 (Reduced Carrier Risk) Antibody:Negative  Anatomic [place]  Normal Female, repeat in 4 weeks to complete anatomy> needs another repeat on 07/07/2018> declined Rubella: imm  GTT  Third trimester: 87 113 65 RPR: neg  Flu vaccine Declined-08/09/18 HBsAg: neg  TDaP vaccine          07/07/2018                                 HIV: neg  Baby Food Breast                                      GBS: Neg  Contraception  Undecided; does not want more kids but is afraid of side-effects from LARCs> no to btl; considering patch Pap: 05/2016, NIL  Circumcision Desired (outpatient)    Pediatrician  [facility] CF; Neg  Support Person  [facility] SMA: Low risk    Past Medical History:  Diagnosis Date  . Anemia    during pregnancy only  . Anxiety   . Environmental allergies   . Heart murmur    as child - no problems    Past Surgical History:  Procedure Laterality Date  . CESAREAN SECTION N/A 11/03/2011   due to failed IOL at 42 weeks - [place]  . CESAREAN SECTION N/A 11/09/2013   Procedure: REPEAT CESAREAN SECTION;  Surgeon: Lesly Dukes, MD;  Location: [facility];  Service: Obstetrics;  Laterality: N/A;  . CESAREAN SECTION Bilateral  12/28/2016   Procedure: REPEAT CESAREAN SECTION;  Surgeon: Adam Phenix, MD;  Location: [facility] BIRTHING SUITES;  Service: Obstetrics;  Laterality: Bilateral;  . PILONIDAL CYST EXCISION N/A 02/2008, 2011   x 2 surgeries    OB History  Gravida Para Term Preterm AB Living  5 3 3  0 1 3  SAB TAB Ectopic Multiple Live Births  1 0 0 0 3    # Outcome Date GA Lbr Len/2nd Weight Sex Delivery Anes PTL Lv  5 Current           4 Term 12/28/16 [redacted]w[redacted]d  2900 g F CS-LTranv Spinal  LIV     Birth Comments: WNL  3 Term 11/09/13 [redacted]w[redacted]d  3115 g F CS-LTranv Spinal  LIV  2 Term 11/03/11 [redacted]w[redacted]d  3345 g M CS-LTranv Spinal N LIV  1 SAB 04/2008            Social History   Socioeconomic History  . Marital status: Single    Spouse name: Not on file  . Number of children: Not on file  . Years of education: Not on file  . Highest education level: Not on  file  Occupational History  . Not on file  Social Needs  . Financial resource strain: Not hard at all  . Food insecurity:    Worry: Never true    Inability: Never true  . Transportation needs:    Medical: No    Non-medical: Not on file  Tobacco Use  . Smoking status: Current Every Day Smoker    Packs/day: 0.50    Years: 11.00    Pack years: 5.50    Types: Cigars  . Smokeless tobacco: Never Used  Substance and Sexual Activity  . Alcohol use: No  . Drug use: Yes    Types: Marijuana    Comment: Stopped Mairjuana early March 2019  . Sexual activity: Yes    Birth control/protection: None    Comment: pregnant  Lifestyle  . Physical activity:    Days per week: 0 days    Minutes per session: 0 min  . Stress: Only a little  Relationships  . Social connections:    Talks on phone: Not on file    Gets together: Not on file    Attends religious service: Not on file    Active member of club or organization: Not on file    Attends meetings of clubs or organizations: Not on file    Relationship status: Not on file  Other Topics Concern  . 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 Moved to [place] from [place], [place] in August 2014.     Family History  Problem Relation Age of Onset  . Hypertension Maternal Grandmother   . Cancer Maternal Grandmother        ovarian   . Hypertension Paternal Grandmother   . Hypertension Mother     Facility-Administered Medications Prior to Admission  Medication Dose Route Frequency Provider Last Rate Last Dose  . ceFAZolin (ANCEF) 2 g in dextrose 5 % 100 mL IVPB  2 g Intravenous Once Adam Phenix, MD       Medications Prior to Admission  Medication Sig Dispense Refill Last Dose  . Prenat w/o A Vit-FeFum-FePo-FA (CONCEPT OB) 130-92.4-1 MG CAPS Take 1 tablet by mouth daily. 30 capsule 12 Past Week at Unknown time  . artificial tears (LACRILUBE) OINT ophthalmic ointment Place into the left eye 3 (three) times daily. (Patient not taking: Reported on 08/09/2018) 1 Tube 0 Not Taking  . Eye Patch MISC Place on left eye for comfort, especially when sleeping (Patient not taking: Reported on 08/09/2018) 1 each 0 Not Taking    Allergies  Allergen Reactions  . Pollen Extract Other (See Comments)    Sneezing, runny eyes    Review of Systems: Pertinent items noted in HPI and remainder of comprehensive ROS otherwise negative.  Physical Exam: BP 133/80   Pulse 82   Temp 97.6 F (36.4 C) (Oral)   Resp (!) 24   Ht 5\' 2"  (1.575 m)   Wt 86.2 kg   LMP 11/26/2017 (Exact Date)   BMI 34.75 kg/m  FHR by Doppler: 130 bpm CONSTITUTIONAL: Well-developed, well-nourished female in no acute distress.  HENT:  Normocephalic, atraumatic, External right and left ear normal. Oropharynx is clear and moist EYES: Conjunctivae and EOM are normal. Pupils are equal, round, and reactive to light. No scleral icterus.  NECK: Normal range of motion, supple, no masses SKIN: Skin is warm and dry. No rash noted. Not diaphoretic. No erythema. No pallor. NEUROLGIC: Alert and oriented to person,  place, and time. Normal reflexes, muscle tone coordination. No cranial nerve deficit noted. PSYCHIATRIC: Normal mood and affect. Normal behavior. Normal judgment and thought content. CARDIOVASCULAR: Normal heart rate noted, regular rhythm RESPIRATORY: Effort and breath sounds normal, no problems with respiration noted ABDOMEN: Soft, nontender, nondistended, gravid. Well-healed Pfannenstiel incision with keloids. PELVIC: Deferred MUSCULOSKELETAL: Normal range of motion. No edema and no tenderness. 2+ distal pulses.   Pertinent Labs/Studies:    Results for orders placed or performed during the hospital encounter of 09/02/18 (from the past 336 hour(s))  Prepare RBC (crossmatch)   Collection Time: 09/02/18 11:30 AM  Result Value Ref Range   Order Confirmation      ORDER PROCESSED BY BLOOD BANK Performed at [facility], [address], [place], [place] [address]   Results for orders placed or performed during the hospital encounter of 09/01/18 (from the past 336 hour(s))  CBC   Collection Time: 09/01/18 10:58 AM  Result Value Ref Range   WBC 9.2 4.0 - 10.5 K/uL   RBC 3.87 3.87 - 5.11 MIL/uL   Hemoglobin 10.9 (L) 12.0 - 15.0 g/dL   HCT 86.5 (L) 78.4 - 69.6 %   MCV 84.0 78.0 - 100.0 fL   MCH 28.2 26.0 - 34.0 pg   MCHC 33.5 30.0 - 36.0 g/dL   RDW 29.5 28.4 - 13.2 %   Platelets 175 150 - 400 K/uL  RPR   Collection Time: 09/01/18 10:58 AM  Result Value Ref Range   RPR Ser Ql Non Reactive Non Reactive  Type and screen [facility]   Collection Time: 09/01/18 10:58 AM  Result Value Ref Range   ABO/RH(D) O POS    Antibody Screen NEG    Sample Expiration      09/04/2018 Performed at [facility], [address], [place], [place] [address]    Unit Number [id number]    Blood Component Type RED CELLS,LR    Unit division 00    Status of Unit ALLOCATED    Transfusion Status OK TO TRANSFUSE    Crossmatch Result Compatible    Unit Number [id number]     Blood Component Type RED CELLS,LR    Unit division 00    Status of Unit ALLOCATED    Transfusion Status OK TO TRANSFUSE    Crossmatch Result Compatible   BPAM RBC   Collection Time: 09/01/18 10:58 AM  Result Value Ref Range   Blood Product Unit Number [id number]    Unit Type and Rh 5100    Blood Product Expiration Date 301601093235    Blood Product Unit Number [id number]    Unit Type and Rh 5100    Blood Product Expiration Date 237628315176     Assessment and Plan :Wilmoth Rasnic is a 32 y.o. [id number] at [redacted]w[redacted]d being admitted for scheduled repeat cesarean section. The risks of cesarean section discussed with the patient included but were not limited to: bleeding which may require transfusion or reoperation (she is crossmatched for two units and will receive tranexamic acid 1 g IV preop); infection which may require antibiotics; injury to bowel, bladder, ureters or other surrounding organs; injury to the fetus; need for additional procedures including hysterectomy in the event of a life-threatening hemorrhage; placental abnormalities wth subsequent pregnancies, incisional problems, thromboembolic phenomenon and other postoperative/anesthesia complications. The patient concurred with the proposed plan, giving informed written consent for the procedure. Patient has been NPO since last night she will remain NPO for procedure. Anesthesia and OR aware. Preoperative prophylactic antibiotics  and SCDs ordered on call to the OR. To OR when ready.     Jaynie Collins, MD, FACOG Obstetrician & Gynecologist, [facility] for [employer], [facility]

## 2018-09-02 NOTE — Anesthesia Procedure Notes (Signed)
Spinal  Patient location during procedure: OR Start time: 09/02/2018 12:22 PM Staffing Anesthesiologist: Mal Amabile, MD Performed: anesthesiologist  Preanesthetic Checklist Completed: patient identified, site marked, surgical consent, pre-op evaluation, timeout performed, IV checked, risks and benefits discussed and monitors and equipment checked Spinal Block Patient position: sitting Prep: site prepped and draped and DuraPrep Patient monitoring: heart rate, cardiac monitor, continuous pulse ox and blood pressure Approach: midline Location: L3-4 Injection technique: single-shot Needle Needle type: Pencan  Needle gauge: 24 G Needle length: 9 cm Needle insertion depth: 7 cm Assessment Sensory level: T4 Additional Notes Patient tolerated procedure well. Adequate sensory level.

## 2018-09-02 NOTE — Anesthesia Postprocedure Evaluation (Signed)
Anesthesia Post Note  Patient: Economist  Procedure(s) Performed: REPEAT CESAREAN SECTION (N/A )     Patient location during evaluation: PACU Anesthesia Type: Spinal Level of consciousness: oriented and awake and alert Pain management: pain level controlled Vital Signs Assessment: post-procedure vital signs reviewed and stable Respiratory status: spontaneous breathing, respiratory function stable, patient connected to nasal cannula oxygen and nonlabored ventilation Cardiovascular status: blood pressure returned to baseline and stable Postop Assessment: no headache, no backache, no apparent nausea or vomiting, spinal receding and patient able to bend at knees Anesthetic complications: no    Last Vitals:  Vitals:   09/02/18 1400 09/02/18 1415  BP: 117/81 125/61  Pulse: 78 78  Resp: 20 15  Temp:  36.6 C  SpO2: 99% 100%    Last Pain:  Vitals:   09/02/18 1415  TempSrc:   PainSc: 0-No pain   Pain Goal: Patients Stated Pain Goal: 5 (09/02/18 1102)  LLE Motor Response: Purposeful movement (09/02/18 1415) LLE Sensation: Numbness;Tingling (09/02/18 1415) RLE Motor Response: Purposeful movement (09/02/18 1415) RLE Sensation: Numbness;Tingling (09/02/18 1415)      Effa Yarrow A.

## 2018-09-02 NOTE — Anesthesia Postprocedure Evaluation (Signed)
Anesthesia Post Note  Patient: Economist  Procedure(s) Performed: REPEAT CESAREAN SECTION (N/A )     Patient location during evaluation: Mother Baby Anesthesia Type: Spinal Level of consciousness: awake and alert and oriented Pain management: satisfactory to patient Vital Signs Assessment: post-procedure vital signs reviewed and stable Respiratory status: respiratory function stable and spontaneous breathing Cardiovascular status: blood pressure returned to baseline Postop Assessment: no headache, no backache, spinal receding, patient able to bend at knees and adequate PO intake Anesthetic complications: no    Last Vitals:  Vitals:   09/02/18 1625 09/02/18 1730  BP:  119/75  Pulse:  61  Resp: (!) 22 18  Temp: 36.7 C (!) 36.1 C  SpO2: 100%     Last Pain:  Vitals:   09/02/18 1730  TempSrc: Axillary  PainSc:    Pain Goal: Patients Stated Pain Goal: 5 (09/02/18 1102)               Bridney Guadarrama

## 2018-09-02 NOTE — Addendum Note (Signed)
Addendum  created 09/02/18 1839 by Graciela Husbands, CRNA   Sign clinical note

## 2018-09-02 NOTE — Transfer of Care (Signed)
Immediate Anesthesia Transfer of Care Note  Patient: Sabrina Bray  Procedure(s) Performed: REPEAT CESAREAN SECTION (N/A )  Patient Location: PACU  Anesthesia Type:Spinal  Level of Consciousness: awake  Airway & Oxygen Therapy: Patient Spontanous Breathing  Post-op Assessment: Report given to RN  Post vital signs: Reviewed and stable  Last Vitals:  Vitals Value Taken Time  BP    Temp    Pulse    Resp    SpO2      Last Pain:  Vitals:   09/02/18 1102  TempSrc: Oral  PainSc: 3       Patients Stated Pain Goal: 5 (09/02/18 1102)  Complications: No apparent anesthesia complications

## 2018-09-02 NOTE — Anesthesia Preprocedure Evaluation (Signed)
Anesthesia Evaluation  Patient identified by MRN, date of birth, ID band Patient awake    Reviewed: Allergy & Precautions, NPO status , Patient's Chart, lab work & pertinent test results  Airway Mallampati: II  TM Distance: >3 FB Neck ROM: Full    Dental no notable dental hx. (+) Teeth Intact   Pulmonary Current Smoker,    Pulmonary exam normal breath sounds clear to auscultation       Cardiovascular negative cardio ROS Normal cardiovascular exam Rhythm:Regular Rate:Normal     Neuro/Psych Anxiety negative neurological ROS     GI/Hepatic Neg liver ROS, GERD  ,  Endo/Other  Obesity  Renal/GU negative Renal ROS  negative genitourinary   Musculoskeletal negative musculoskeletal ROS (+)   Abdominal (+) + obese,   Peds  Hematology  (+) anemia ,   Anesthesia Other Findings   Reproductive/Obstetrics (+) Pregnancy Previous C/section x 2                             Anesthesia Physical Anesthesia Plan  ASA: II  Anesthesia Plan: Spinal   Post-op Pain Management:    Induction:   PONV Risk Score and Plan: 3 and Ondansetron, Dexamethasone and Scopolamine patch - Pre-op  Airway Management Planned: Natural Airway  Additional Equipment:   Intra-op Plan:   Post-operative Plan:   Informed Consent: I have reviewed the patients History and Physical, chart, labs and discussed the procedure including the risks, benefits and alternatives for the proposed anesthesia with the patient or authorized representative who has indicated his/her understanding and acceptance.     Plan Discussed with: CRNA and Surgeon  Anesthesia Plan Comments:         Anesthesia Quick Evaluation

## 2018-09-02 NOTE — Op Note (Signed)
Autymn Omlor PROCEDURE DATE: 09/02/2018  PREOPERATIVE DIAGNOSES: Intrauterine pregnancy at [redacted]w[redacted]d weeks gestation; previous cesarean section x 3  POSTOPERATIVE DIAGNOSES: The same  PROCEDURE: Repeat Low Transverse Cesarean Section  SURGEON:  Dr. Jaynie Collins  ASSISTANT:  Dr. Tinnie Gens and Dr. Jackquline Berlin.  An experienced assistant was required given the standard of surgical care given the complexity of the case.  This assistant was needed for exposure, dissection, suctioning, retraction, instrument exchange, assisting with delivery with administration of fundal pressure, and for overall help during the procedure.  ANESTHESIOLOGY TEAM: Anesthesiologist: Mal Amabile, MD CRNA: Algis Greenhouse, CRNA  INDICATIONS: Sabrina Bray is a 32 y.o. [phone] at [redacted]w[redacted]d here for cesarean section secondary to the indications listed under preoperative diagnoses; please see preoperative note for further details.  The risks of cesarean section were discussed with the patient including but were not limited to: bleeding which may require transfusion or reoperation; infection which may require antibiotics; injury to bowel, bladder, ureters or other surrounding organs; injury to the fetus; need for additional procedures including hysterectomy in the event of a life-threatening hemorrhage; placental abnormalities wth subsequent pregnancies, incisional problems, thromboembolic phenomenon and other postoperative/anesthesia complications.   The patient concurred with the proposed plan, giving informed written consent for the procedure.    FINDINGS:  Viable female infant in cephalic presentation.  Apgars 8 and 9.  Clear amniotic fluid.  Intact placenta, three vessel cord.  Normal uterus, fallopian tubes and ovaries bilaterally. Dense adhesive tissue involving fascia, rectus muscles and peritoneal layers to the anterior abdominal wall especially in the midline. This made entry to the uterus difficult.   Intraperitoneally, there was not much adhesive disease. She may benefit from midline skin incision for her next cesarean section.  ANESTHESIA: Combined Spinal-Epidural  INTRAVENOUS FLUIDS: 2200 ml   ESTIMATED BLOOD LOSS: 425 ml URINE OUTPUT:  75 ml SPECIMENS: Placenta sent to L&D COMPLICATIONS: None immediate  PROCEDURE IN DETAIL:  The patient preoperatively received intravenous antibiotics and had sequential compression devices applied to her lower extremities.  She was then taken to the operating room where anesthesia was administered and was found to be adequate. She was then placed in a dorsal supine position with a leftward tilt, and prepped and draped in a sterile manner.  A foley catheter was placed into her bladder and attached to constant gravity.  After an adequate timeout was performed, a Pfannenstiel skin incision was made with scalpel on her preexisting scar and carried through to the underlying layer of fascia. The fascia was incised in the midline, and this incision was extended bilaterally using the Mayo scissors.  We were unable to separate the fascia from the underlying rectus muscles so there rectus muscles were separately sharply and the also encountered the large adhesion. This was carefully taken down and the incision was extended bilaterally involving the adherent rectus and peritoneal layers. The uterus was noted to be in view, and further blunt stretching resulting in adequate exposure. The Alexis self-retaining retractor was introduced into the abdominal cavity.  Attention was turned to the lower uterine segment where a low transverse hysterotomy was made with a scalpel and extended bilaterally bluntly.  The infant was successfully delivered, the cord was clamped and cut after one minute, and the infant was handed over to the awaiting neonatology team. Uterine massage was then administered, and the placenta delivered intact with a three-vessel cord. The uterus was then cleared of  clots and debris.  The hysterotomy was closed with 0 Vicryl  in a running locked fashion, and an imbricating layer was also placed with 0 Vicryl.  Figure-of-eight 0 Vicryl serosal stitches were placed to help with hemostasis.  The pelvis was cleared of all clot and debris. Hemostasis was confirmed on all surfaces.  The retractor was removed.   The fascia was then closed using 0 PDS in a running fashion.  The subcutaneous layer was irrigated, and the skin was closed with a 4-0 Vicryl subcuticular stitch. The patient tolerated the procedure well. Sponge, instrument and needle counts were correct x 3.  She was taken to the recovery room in stable condition.    Jaynie Collins, MD, FACOG Obstetrician & Gynecologist, [facility] [employer], [facility]

## 2018-09-03 ENCOUNTER — Encounter (HOSPITAL_COMMUNITY): Payer: Self-pay | Admitting: Obstetrics & Gynecology

## 2018-09-03 LAB — CBC
HEMATOCRIT: 27 % — AB (ref 36.0–46.0)
HEMOGLOBIN: 9.1 g/dL — AB (ref 12.0–15.0)
MCH: 28.1 pg (ref 26.0–34.0)
MCHC: 33.7 g/dL (ref 30.0–36.0)
MCV: 83.3 fL (ref 78.0–100.0)
Platelets: 148 10*3/uL — ABNORMAL LOW (ref 150–400)
RBC: 3.24 MIL/uL — ABNORMAL LOW (ref 3.87–5.11)
RDW: 13.3 % (ref 11.5–15.5)
WBC: 7.7 10*3/uL (ref 4.0–10.5)

## 2018-09-03 LAB — CREATININE, SERUM
Creatinine, Ser: 0.39 mg/dL — ABNORMAL LOW (ref 0.44–1.00)
GFR calc Af Amer: >60 mL/min (ref >60)
GFR calc non Af Amer: >60 mL/min (ref >60)
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
## 2018-09-03 NOTE — Progress Notes (Addendum)
POSTPARTUM PROGRESS NOTE  Post Partum Day 1 Subjective:  Sabrina Bray is a 32 y.o. [id number] [redacted]w[redacted]d s/p rLTCS of a baby boy. No acute events overnight, but mom states this is the most bleeding she's had of her babies. Bleeding is improving.  Pt denies problems with ambulating, voiding or po intake.  She denies nausea or vomiting.  Pain is moderately controlled.  She has had flatus. She has not had bowel movement.  Lochia Small.   Objective: Blood pressure 114/76, pulse 73, temperature 97.9 F (36.6 C), temperature source Oral, resp. rate 18, height 5\' 2"  (1.575 m), weight 86.2 kg, last menstrual period 11/26/2017, SpO2 99 %, unknown if currently breastfeeding.  Physical Exam:  General: alert, cooperative and no distress Lochia: appropriate Abdomen: diffusely tender,  Uterine Fundus: firm Incision: clear and dry DVT Evaluation: No calf swelling or tenderness Extremities: no LE edema b/l  Recent Labs    09/01/18 1058 09/03/18 0640  HGB 10.9* 9.1*  HCT 32.5* 27.0*    Assessment/Plan:  ASSESSMENT: Regana Kemple is a 32 y.o. [id number] [redacted]w[redacted]d s/p rLTCS of a baby boy. EBL  #Bottlefeeding > Breast #Contraception: ensure of method of choice, would like to talk about it later #Circumcision: outpatient #Hgb 9.1 from 10.9, endorses fatigue, start iron, continue to monitor #DVT Prophylaxis: lovenox 40mg   #Possible discharge later today   LOS: 1 day   Basilio Cairo, Medical Student 09/03/2018, 7:12 AM   STUDENT ADDENDUM I have separately seen and examined the patient. I have discussed the findings and exam with the medical student and agree with the above note. I helped develop the management plan that is described in the student's note, and I agree with the content.  Clayton Bibles, CNM 09/03/18  8:49 AM

## 2018-09-04 MED ORDER — AMLODIPINE BESYLATE 5 MG PO TABS
5.0000 mg | ORAL_TABLET | Freq: Every day | ORAL | Status: DC
  Filled 2018-09-04 (×3): qty 1

## 2018-09-04 NOTE — Progress Notes (Signed)
POSTPARTUM PROGRESS NOTE  Post Partum Day 2 Subjective:  Sabrina Bray is a 32 y.o. [id number] [redacted]w[redacted]d s/p rLTCS of a baby boy.  No acute events overnight. Pt complains of worsening stomach pains but she has not been taking the full dose of her pain meds. Because she doesn't want to be drowsy all day. Once family comes, she might take percocet x2, and try to rest.  Pt denies problems with ambulating, voiding or po intake.  She denies nausea or vomiting.  Pain is poorly controlled.  She has had flatus. She has not had bowel movement.  Lochia Minimal.   Objective: Blood pressure (!) 155/96, pulse 70, temperature 97.8 F (36.6 C), temperature source Axillary, resp. rate 18, height 5\' 2"  (1.575 m), weight 86.2 kg, last menstrual period 11/26/2017, SpO2 100 %, unknown if currently breastfeeding.  Physical Exam:  General: alert, cooperative and no distress Lochia:normal flow Chest: CTAB Heart: RRR, mild flow murmur along left sternal border Abdomen: +BS, soft, tender diffusely throughout. Greater on left and lower quadrants around incision. Incision: clean, dry  Uterine Fundus: firm DVT Evaluation: No calf swelling or tenderness Extremities: no LE edema b/l  Recent Labs    09/01/18 1058 09/03/18 0640  HGB 10.9* 9.1*  HCT 32.5* 27.0*    Assessment/Plan:  ASSESSMENT: Sabrina Bray is a 32 y.o. [id number] [redacted]w[redacted]d s/p s/p rLTCS of a baby boy. POD#2 today.  #Bottle & Breast feeding #Contraception: still thinking about patch, used Depo in her teens. #Circumcision: outpatient #Pain: ibuprofen 600mg  + oxycodone-acetaminophen (5-325)x1, will do attempt oxy-acetaminophen x2 #Hgb 9.1 yesterday, no sx, continue iron #DVT prophylaxis: lovenox 40mg   #BP have been high, most recent 139/94, previously 155/96, start norvasc 5mg  #Discharge POD#2 or #3   LOS: 2 days   Basilio Cairo, Medical Student 09/04/2018, 6:50 AM

## 2018-09-05 ENCOUNTER — Encounter (HOSPITAL_COMMUNITY): Payer: Self-pay | Admitting: *Deleted

## 2018-09-05 ENCOUNTER — Telehealth: Payer: Self-pay | Admitting: Family Medicine

## 2018-09-05 LAB — TYPE AND SCREEN
ABO/RH(D): O POS
Antibody Screen: NEGATIVE
UNIT DIVISION: 0
Unit division: 0

## 2018-09-05 LAB — BPAM RBC
Blood Product Expiration Date: 201911032359
Blood Product Expiration Date: 201911032359
UNIT TYPE AND RH: 5100
UNIT TYPE AND RH: 5100

## 2018-09-05 MED ORDER — SENNOSIDES-DOCUSATE SODIUM 8.6-50 MG PO TABS: 2.0000 | ORAL_TABLET | ORAL | 0 refills | Status: AC

## 2018-09-05 MED ORDER — POLYETHYLENE GLYCOL 3350 17 G PO PACK: 17.0000 g | PACK | Freq: Every day | ORAL | Status: DC

## 2018-09-05 MED ORDER — POLYETHYLENE GLYCOL 3350 17 G PO PACK
34.0000 g | PACK | Freq: Once | ORAL | Status: DC
  Filled 2018-09-05: qty 2

## 2018-09-05 MED ORDER — IBUPROFEN 600 MG PO TABS: 600.0000 mg | ORAL_TABLET | Freq: Four times a day (QID) | ORAL | 0 refills | Status: AC

## 2018-09-05 MED ORDER — BISACODYL 10 MG RE SUPP: 10.0000 mg | Freq: Every day | RECTAL | Status: DC | PRN

## 2018-09-05 MED ORDER — OXYCODONE-ACETAMINOPHEN 5-325 MG PO TABS: 1.0000 | ORAL_TABLET | ORAL | 0 refills | Status: AC | PRN

## 2018-09-05 NOTE — Telephone Encounter (Signed)
Called patient in regards to getting her scheduled for her incision check. VM was full and could not make appt. Made appt and mailed appt reminder letter.

## 2018-09-05 NOTE — Discharge Summary (Addendum)
Postpartum Discharge Summary     Patient Name: Sabrina Bray DOB: 16-Sep-1986 MRN: [id number]  Date of admission: 09/02/2018 Delivering Provider: Jaynie Collins A   Date of discharge: 09/05/2018  Admitting diagnosis: RCS Intrauterine pregnancy: [redacted]w[redacted]d     Secondary diagnosis:  Principal Problem:   s/p 4th cesarean section Active Problems:   Encounter for supervision of normal pregnancy in multigravida   S/P cesarean section  Additional problems: none     Discharge diagnosis: Term Pregnancy Delivered and Gestational Hypertension                                                                                                Post partum procedures:none  Augmentation: None  Complications: None  Hospital course:  Sceduled C/S   32 y.o. yo [id number] at [redacted]w[redacted]d was admitted to the hospital 09/02/2018 for scheduled cesarean section with the following indication:Elective Repeat.  Membrane Rupture Time/Date: 1:11 PM ,09/02/2018   Patient delivered a Viable infant.09/02/2018  Details of operation can be found in separate operative note.  Pateint had an uncomplicated postpartum course.  She is ambulating, tolerating a regular diet, passing flatus, and urinating well. Patient is discharged home in stable condition on  09/05/18   Patient continued to be hypertensive but refused medication treatment with norvasc.         Magnesium Sulfate recieved: No BMZ received: No  Physical exam  Vitals:   09/04/18 0940 09/04/18 1438 09/04/18 2202 09/05/18 0638  BP: (!) 138/91 (!) 138/95 133/78 140/85  Pulse: 76 66 79 63  Resp:   18 18  Temp:  98 F (36.7 C) 98.6 F (37 C) 97.8 F (36.6 C)  TempSrc:  Oral Oral Oral  SpO2:  100%    Weight:      Height:       General: alert, cooperative and no distress Lochia: appropriate Uterine Fundus: firm Incision: Healing well with no significant drainage, No significant erythema, Dressing is clean, dry, and intact DVT Evaluation: No evidence of DVT seen  on physical exam. No cords or calf tenderness. No significant calf/ankle edema. Labs: Lab Results  Component Value Date   WBC 7.7 09/03/2018   HGB 9.1 (L) 09/03/2018   HCT 27.0 (L) 09/03/2018   MCV 83.3 09/03/2018   PLT 148 (L) 09/03/2018   CMP Latest Ref Rng & Units 09/03/2018  Glucose 70 - 99 mg/dL -  BUN 6 - 23 mg/dL -  Creatinine 1.47 - 8.29 mg/dL 5.62(Z)  Sodium 308 - 657 mEq/L -  Potassium 3.5 - 5.1 mEq/L -  Chloride 96 - 112 mEq/L -  CO2 19 - 32 mEq/L -  Calcium 8.4 - 10.5 mg/dL -  Total Protein 6.0 - 8.3 g/dL -  Total Bilirubin 0.3 - 1.2 mg/dL -  Alkaline Phos 39 - 846 U/L -  AST 0 - 37 U/L -  ALT 0 - 35 U/L -    Discharge instruction: per After Visit Summary and "Baby and Me Booklet".  After visit meds:  Allergies as of 09/05/2018      Reactions   Pollen Extract Other (See  Comments)   Sneezing, runny eyes      Medication List    TAKE these medications   artificial tears Oint ophthalmic ointment Commonly known as:  LACRILUBE Place into the left eye 3 (three) times daily.   CONCEPT OB 130-92.4-1 MG Caps Take 1 tablet by mouth daily.   Eye Patch Misc Place on left eye for comfort, especially when sleeping   ibuprofen 600 MG tablet Commonly known as:  ADVIL,MOTRIN Take 1 tablet (600 mg total) by mouth every 6 (six) hours.   oxyCODONE-acetaminophen 5-325 MG tablet Commonly known as:  PERCOCET/ROXICET Take 1 tablet by mouth every 4 (four) hours as needed (pain scale 4-7).   senna-docusate 8.6-50 MG tablet Commonly known as:  Senokot-S Take 2 tablets by mouth daily. Start taking on:  09/06/2018       Diet: routine diet  Activity: Advance as tolerated. Pelvic rest for 6 weeks.   Outpatient follow up:2 weeks Follow up Appt:No future appointments. Follow up Visit:No follow-ups on file.  Can consider anithypertensive treatment if continued to be hypertensive Will d/c with PP baby scripts to monitor BP  Please schedule this patient for Postpartum  visit in: 2wks with the following provider: MD For C/S patients schedule nurse incision check in weeks 2 weeks: yes Low risk pregnancy complicated by: HTN after delivery Delivery mode:  CS Anticipated Birth Control:  other/unsure PP Procedures needed: BP check  Schedule Integrated BH visit: no   Newborn Data: Live born female  Birth Weight: 7 lb 10.6 oz (3476 g) APGAR: 8, 9  Newborn Delivery   Birth date/time:  09/02/2018 13:11:00 Delivery type:  C-Section, Low Transverse Trial of labor:  No C-section categorization:  Repeat     Baby Feeding: Breast Disposition:home with mother   09/05/2018 Oralia Manis, DO  PGY-2  I confirm that I have verified the information documented in the resident's note and that I have also personally reperformed the physical exam and all medical decision making activities.  Sharyon Cable, CNM 09/05/18, 9:34 AM

## 2018-09-05 NOTE — Lactation Note (Signed)
This note was copied from a baby's chart. Lactation Consultation Note  Patient Name: Sabrina Bray ZOXWR'U Date: 09/05/2018 Reason for consult: Follow-up assessment;Term Mom has been formula feeding but would like to try latching with a nipple shield now that milk is in.  Breasts are full but not engorged.  Nipples short.  Baby recently had a formula feeding and he is sleepy and not showing feeding cues.  Waking techniques done and baby positioned in football hold.  20 mm nipple shield applied with instructions.  Baby mostly held nipple in his mouth with only a few sucks elicited. Good breast massage and compression encouraged.  Instructed to try again when baby starts to cue and to pump every 3 hours if no latch or poor feeding.  Mom has a manual pump.  Discussed prevention and treatment of engorgement.  Reviewed outpatient services.  Maternal Data    Feeding Feeding Type: Breast Fed  LATCH Score Latch: Repeated attempts needed to sustain latch, nipple held in mouth throughout feeding, stimulation needed to elicit sucking reflex.  Audible Swallowing: None  Type of Nipple: Everted at rest and after stimulation  Comfort (Breast/Nipple): Soft / non-tender  Hold (Positioning): Assistance needed to correctly position infant at breast and maintain latch.  LATCH Score: 6  Interventions Interventions: Assisted with latch;Breast compression;Adjust position;Breast massage;Support pillows;DEBP  Lactation Tools Discussed/Used Tools: Nipple Shields Nipple shield size: 20   Consult Status Consult Status: Complete Follow-up type: Call as needed    Sabrina Bray 09/05/2018, 10:29 AM

## 2018-09-05 NOTE — Discharge Instructions (Signed)

## 2018-09-05 NOTE — Lactation Note (Signed)
This note was copied from a baby's chart. Lactation Consultation Note Baby 18 hrs old. Mom called out asking for NS d/t may put baby to breast. Mom stated she BF one of her children w/NS.  Moms breast full w/knots and leaking. Mom has small everted nipples.  Mom has hand pump. Discussed pumping. Mom has small short shaft nipples. Gave mom #21 flanges. Mom shown how to use DEBP & how to disassemble, clean, & reassemble parts. Mom knows to pump q3h for 15-20 min.  Breast massage, pumping, supply and demand, asked mom to call for next feeding before giving formula.  Discussed milk storage and engorgement. Call for assistance and next feeding.   Patient Name: Sabrina Bray ZOXWR'U Date: 09/05/2018 Reason for consult: Follow-up assessment;Engorgement;Mother's request   Maternal Data    Feeding Feeding Type: Bottle Fed - Formula Nipple Type: Slow - flow  LATCH Score       Type of Nipple: Everted at rest and after stimulation  Comfort (Breast/Nipple): Engorged, cracked, bleeding, large blisters, severe discomfort        Interventions Interventions: DEBP;Support pillows;Position options;Breast massage;Expressed milk;Hand express;Breast compression;Hand pump  Lactation Tools Discussed/Used Tools: Pump;Flanges Flange Size: 21 Breast pump type: Double-Electric Breast Pump;Manual Pump Review: Setup, frequency, and cleaning;Milk Storage Initiated by:: Peri Jefferson RN IBCLC Date initiated:: 09/05/18   Consult Status Consult Status: Follow-up Date: 09/05/18 Follow-up type: In-patient    Charyl Dancer 09/05/2018, 6:21 AM

## 2018-09-06 ENCOUNTER — Other Ambulatory Visit: Payer: Self-pay | Admitting: Family Medicine

## 2018-09-06 DIAGNOSIS — O1495 Unspecified pre-eclampsia, complicating the puerperium: Secondary | ICD-10-CM

## 2018-09-06 MED ORDER — ENALAPRIL MALEATE 10 MG PO TABS: 10.0000 mg | ORAL_TABLET | Freq: Every day | ORAL | 0 refills | Status: AC

## 2018-09-06 NOTE — Progress Notes (Signed)
Vasotec sent in for her BP's which are high.

## 2018-09-10 ENCOUNTER — Telehealth: Payer: Self-pay | Admitting: Family Medicine

## 2018-09-10 ENCOUNTER — Encounter: Payer: Self-pay | Admitting: Family Medicine

## 2018-09-10 NOTE — Telephone Encounter (Signed)
Spoke with Coralyn Mark, she did not pick up her prescription. Willing to go get it now, since her bp remains elevated. Will see if BP's come down.

## 2018-09-10 NOTE — Progress Notes (Signed)
Babyscripts app blood pressures  Have sent in an rx--increase to 20 mg/day if taking the 10 mg previously prescribed.awaiting response to MyChart message.

## 2018-09-16 ENCOUNTER — Ambulatory Visit: Payer: Medicaid Other

## 2018-09-19 ENCOUNTER — Ambulatory Visit: Payer: Medicaid Other

## 2018-09-29 ENCOUNTER — Ambulatory Visit: Payer: Medicaid Other

## 2018-10-11 ENCOUNTER — Ambulatory Visit: Payer: Medicaid Other | Admitting: Student

## 2019-05-23 ENCOUNTER — Inpatient Hospital Stay (HOSPITAL_COMMUNITY)
Admission: AD | Admit: 2019-05-23 | Discharge: 2019-05-23 | Discharge status: Against Medical Advice | Payer: Medicaid Other | Attending: Family Medicine | Admitting: Family Medicine

## 2019-05-23 ENCOUNTER — Encounter (HOSPITAL_COMMUNITY): Payer: Self-pay | Admitting: *Deleted

## 2019-05-23 ENCOUNTER — Other Ambulatory Visit: Payer: Self-pay

## 2019-05-23 DIAGNOSIS — Z5321 Procedure and treatment not carried out due to patient leaving prior to being seen by health care provider: Secondary | ICD-10-CM | POA: Insufficient documentation

## 2019-05-23 DIAGNOSIS — R109 Unspecified abdominal pain: Secondary | ICD-10-CM | POA: Insufficient documentation

## 2019-05-23 DIAGNOSIS — O9989 Other specified diseases and conditions complicating pregnancy, childbirth and the puerperium: Secondary | ICD-10-CM | POA: Insufficient documentation

## 2019-05-23 LAB — URINALYSIS, ROUTINE W REFLEX MICROSCOPIC
Bilirubin Urine: NEGATIVE
Glucose, UA: NEGATIVE mg/dL
Ketones, ur: NEGATIVE mg/dL
Leukocytes,Ua: NEGATIVE
Nitrite: NEGATIVE
Protein, ur: NEGATIVE mg/dL
Specific Gravity, Urine: 1.03 (ref 1.005–1.030)
pH: 5 (ref 5.0–8.0)

## 2019-05-23 LAB — CBC
HCT: 39.4 % (ref 36.0–46.0)
Hemoglobin: 12.4 g/dL (ref 12.0–15.0)
MCH: 25.1 pg — ABNORMAL LOW (ref 26.0–34.0)
MCHC: 31.5 g/dL (ref 30.0–36.0)
MCV: 79.6 fL — ABNORMAL LOW (ref 80.0–100.0)
Platelets: 261 10*3/uL (ref 150–400)
RBC: 4.95 MIL/uL (ref 3.87–5.11)
RDW: 15.9 % — ABNORMAL HIGH (ref 11.5–15.5)
WBC: 7.5 10*3/uL (ref 4.0–10.5)
nRBC: 0 % (ref 0.0–0.2)

## 2019-05-23 LAB — HCG, QUANTITATIVE, PREGNANCY: hCG, Beta Chain, Quant, S: 13893 m[IU]/mL — ABNORMAL HIGH (ref <5)

## 2019-05-23 LAB — POCT PREGNANCY, URINE: Preg Test, Ur: POSITIVE — AB

## 2019-05-23 NOTE — MAU Note (Signed)
Pt walked to nurse's desk upset that she had to wait so long without getting to a room. I had spoken with her about  2030 and explained I did not have a room for her at that time. She was upset that others were going back ahead of her. 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 Pt then walked out of MAU and left

## 2019-05-23 NOTE — MAU Note (Signed)
Around 2/3 this morning, started having a brown d/c. Looks like old blood now.  Been having some cramping. +HPT. ? April or May.

## 2019-05-24 LAB — HIV ANTIBODY (ROUTINE TESTING W REFLEX): HIV Screen 4th Generation wRfx: NONREACTIVE

## 2022-12-13 ENCOUNTER — Emergency Department (HOSPITAL_COMMUNITY)
Admission: EM | Admit: 2022-12-13 | Discharge: 2022-12-13 | Disposition: A | Discharge status: Home / Self Care | Payer: Self-pay | Attending: Student | Admitting: Student

## 2022-12-13 ENCOUNTER — Encounter (HOSPITAL_COMMUNITY): Payer: Self-pay | Admitting: Emergency Medicine

## 2022-12-13 ENCOUNTER — Other Ambulatory Visit: Payer: Self-pay

## 2022-12-13 DIAGNOSIS — L0291 Cutaneous abscess, unspecified: Secondary | ICD-10-CM

## 2022-12-13 DIAGNOSIS — L0501 Pilonidal cyst with abscess: Secondary | ICD-10-CM | POA: Insufficient documentation

## 2022-12-13 MED ORDER — DOXYCYCLINE HYCLATE 100 MG PO CAPS: 100.0000 mg | ORAL_CAPSULE | Freq: Two times a day (BID) | ORAL | 0 refills | Status: AC

## 2022-12-13 NOTE — ED Provider Notes (Signed)
[facility] DEPT Provider Note   CSN: [id number] Arrival date & time: 12/13/22  0553     History  Chief Complaint  Patient presents with   Cyst    Sabrina Bray is a 37 y.o. female.  HPI   Patient presents to pilonidal cyst.  She has a history of previous ones often requiring 2 surgical interventions for removal.  States she started hurting 2 days ago, she has not had any draining.  She is able to defecate, does not move anywhere else.  Not on any antibiotics recently.  Home Medications Prior to Admission medications   Medication Sig Start Date End Date Taking? Authorizing Provider  doxycycline (VIBRAMYCIN) 100 MG capsule Take 1 capsule (100 mg total) by mouth 2 (two) times daily. 12/13/22  Yes Sherrill Raring, PA-C  artificial tears (LACRILUBE) OINT ophthalmic ointment Place into the left eye 3 (three) times daily. Patient not taking: Reported on 08/09/2018 07/29/18   [place]idence Lanius A, PA-C  enalapril (VASOTEC) 10 MG tablet Take 1 tablet (10 mg total) by mouth daily. 09/06/18   Donnamae Jude, MD  Eye Patch Stewart on left eye for comfort, especially when sleeping Patient not taking: Reported on 08/09/2018 07/29/18   Volanda Napoleon, PA-C  ibuprofen (ADVIL,MOTRIN) 600 MG tablet Take 1 tablet (600 mg total) by mouth every 6 (six) hours. 09/05/18   Caroline More, DO  oxyCODONE-acetaminophen (PERCOCET/ROXICET) 5-325 MG tablet Take 1 tablet by mouth every 4 (four) hours as needed (pain scale 4-7). 09/05/18   Caroline More, DO  Prenat w/o A Vit-FeFum-FePo-FA (CONCEPT OB) 130-92.4-1 MG CAPS Take 1 tablet by mouth daily. 02/23/18   Tamala Julian, [place], CNM  senna-docusate (SENOKOT-S) 8.6-50 MG tablet Take 2 tablets by mouth daily. 09/06/18   Caroline More, DO      Allergies    Pollen extract    Review of Systems   Review of Systems  Physical Exam Updated Vital Signs BP (!) 138/90 (BP Location: Right Arm)   Pulse 88   Temp 98.1 F (36.7 C) (Oral)    Resp 18   Ht 5\' 3"  (1.6 m)   Wt 81.6 kg   SpO2 100%   BMI 31.89 kg/m  Physical Exam Vitals and nursing note reviewed. Exam conducted with a chaperone present.  Constitutional:      General: She is not in acute distress.    Appearance: Normal appearance.  HENT:     Head: Normocephalic and atraumatic.  Eyes:     General: No scleral icterus.    Extraocular Movements: Extraocular movements intact.     Pupils: Pupils are equal, round, and reactive to light.  Genitourinary:    Comments: Exam performed with chaperone April paramedic.  Patient has indurated area to the right gluteal cleft, does not extend into the rectum.  I do not appreciate any fluctuance, there is some overlying erythema. Skin:    Coloration: Skin is not jaundiced.  Neurological:     Mental Status: She is alert. Mental status is at baseline.     Coordination: Coordination normal.     ED Results / Procedures / Treatments   Labs (all labs ordered are listed, but only abnormal results are displayed) Labs Reviewed - No data to display  EKG None  Radiology No results found.  Procedures Procedures    Medications Ordered in ED Medications - No data to display  ED Course/ Medical Decision Making/ A&P  Medical Decision Making Risk Prescription drug management.   Patient presents due to pilonidal abscess.  On exam it is very indurated but I cannot appreciate any fluctuance.  Does not extend into the rectum, there is no extension of cellulitis into the genital region and no signs of Fournier's gangrene or acute emergent condition.  I engaged in shared decision making with patient, we will trial oral antibiotics and have her follow-up with her surgeon tomorrow.  Return precautions were discussed, discharged in stable condition.  Do not think she is amenable to incision and drainage here in the ED.        Final Clinical Impression(s) / ED Diagnoses Final diagnoses:  Abscess     Rx / DC Orders ED Discharge Orders          Ordered    doxycycline (VIBRAMYCIN) 100 MG capsule  2 times daily        12/13/22 0939              Sherrill Raring, PA-C 12/13/22 [place] [facility], Debe Coder, MD 12/13/22 1002

## 2022-12-13 NOTE — Discharge Instructions (Addendum)
You are seen to the emergency department for pilonidal cyst.  Unfortunately I do not think it is amenable to drainage at this time.  Will start antibiotics, take the doxycycline twice daily for the next 7 days.  Do warm sitz bath's, call your surgeon tomorrow to schedule an appointment in the office to have it drained.  He can return to the ED if things change or worsen.

## 2022-12-13 NOTE — ED Triage Notes (Signed)
Pt presents from home for new pilonidal cyst on R buttock.  Has had previous surgeries for this problem Not yet draining.  Denies fever
